# Patient Record
Sex: Male | Born: 1985
Health system: Southern US, Community
[De-identification: ages and names within clinical notes are randomized; demographics above are authoritative.]

## PROBLEM LIST (undated history)

## (undated) DIAGNOSIS — Z9109 Other allergy status, other than to drugs and biological substances: Secondary | ICD-10-CM

## (undated) HISTORY — DX: Other allergy status, other than to drugs and biological substances: Z91.09

## (undated) HISTORY — PX: VASECTOMY: SHX75

## (undated) HISTORY — PX: HERNIA REPAIR: SHX51

---

## 2004-08-20 ENCOUNTER — Ambulatory Visit: Payer: Self-pay | Admitting: Family Medicine

## 2005-05-24 ENCOUNTER — Ambulatory Visit: Payer: Self-pay | Admitting: Family Medicine

## 2005-11-09 ENCOUNTER — Ambulatory Visit: Payer: Self-pay | Admitting: Family Medicine

## 2006-08-02 ENCOUNTER — Encounter: Payer: Self-pay | Admitting: Family Medicine

## 2006-08-02 ENCOUNTER — Ambulatory Visit: Payer: Self-pay | Admitting: Family Medicine

## 2006-08-02 DIAGNOSIS — J309 Allergic rhinitis, unspecified: Secondary | ICD-10-CM | POA: Insufficient documentation

## 2007-02-09 ENCOUNTER — Ambulatory Visit: Payer: Self-pay | Admitting: Family Medicine

## 2007-02-09 DIAGNOSIS — L600 Ingrowing nail: Secondary | ICD-10-CM | POA: Insufficient documentation

## 2007-10-03 ENCOUNTER — Ambulatory Visit: Payer: Self-pay | Admitting: Family Medicine

## 2007-10-03 DIAGNOSIS — L740 Miliaria rubra: Secondary | ICD-10-CM

## 2007-10-04 ENCOUNTER — Telehealth: Payer: Self-pay | Admitting: Gastroenterology

## 2007-10-04 ENCOUNTER — Telehealth (INDEPENDENT_AMBULATORY_CARE_PROVIDER_SITE_OTHER): Payer: Self-pay | Admitting: Internal Medicine

## 2007-10-05 ENCOUNTER — Encounter (INDEPENDENT_AMBULATORY_CARE_PROVIDER_SITE_OTHER): Payer: Self-pay | Admitting: Internal Medicine

## 2007-10-05 ENCOUNTER — Ambulatory Visit: Payer: Self-pay | Admitting: Gastroenterology

## 2007-10-05 DIAGNOSIS — K625 Hemorrhage of anus and rectum: Secondary | ICD-10-CM

## 2007-10-05 LAB — CONVERTED CEMR LAB
AST: 17 units/L (ref 0–37)
Alkaline Phosphatase: 73 units/L (ref 39–117)
Basophils Absolute: 0.1 10*3/uL (ref 0.0–0.1)
Chloride: 103 meq/L (ref 96–112)
Eosinophils Absolute: 0.7 10*3/uL (ref 0.0–0.7)
Eosinophils Relative: 10.5 % — ABNORMAL HIGH (ref 0.0–5.0)
Ferritin: 22.1 ng/mL (ref 22.0–322.0)
GFR calc Af Amer: 120 mL/min
GFR calc non Af Amer: 99 mL/min
MCHC: 34.3 g/dL (ref 30.0–36.0)
MCV: 89 fL (ref 78.0–100.0)
Neutrophils Relative %: 50.8 % (ref 43.0–77.0)
Platelets: 325 10*3/uL (ref 150–400)
Potassium: 4.8 meq/L (ref 3.5–5.1)
RDW: 12.6 % (ref 11.5–14.6)
Rhuematoid fact SerPl-aCnc: <20 intl units/mL — ABNORMAL LOW (ref 0.0–20.0)
Saturation Ratios: 11.9 % — ABNORMAL LOW (ref 20.0–50.0)
Sodium: 139 meq/L (ref 135–145)
TSH: 1.08 microintl units/mL (ref 0.35–5.50)
Total Bilirubin: 2.3 mg/dL — ABNORMAL HIGH (ref 0.3–1.2)
Vitamin B-12: 1005 pg/mL — ABNORMAL HIGH (ref 211–911)
WBC: 6.8 10*3/uL (ref 4.5–10.5)

## 2007-10-08 ENCOUNTER — Encounter: Payer: Self-pay | Admitting: Gastroenterology

## 2007-10-08 ENCOUNTER — Ambulatory Visit: Payer: Self-pay | Admitting: Gastroenterology

## 2007-10-09 ENCOUNTER — Telehealth: Payer: Self-pay | Admitting: Gastroenterology

## 2007-10-10 ENCOUNTER — Telehealth: Payer: Self-pay | Admitting: Gastroenterology

## 2007-10-11 ENCOUNTER — Ambulatory Visit: Payer: Self-pay | Admitting: Gastroenterology

## 2007-10-11 ENCOUNTER — Encounter (INDEPENDENT_AMBULATORY_CARE_PROVIDER_SITE_OTHER): Payer: Self-pay | Admitting: *Deleted

## 2007-10-11 DIAGNOSIS — K5150 Left sided colitis without complications: Secondary | ICD-10-CM

## 2007-10-15 ENCOUNTER — Encounter: Payer: Self-pay | Admitting: Gastroenterology

## 2007-10-24 ENCOUNTER — Telehealth: Payer: Self-pay | Admitting: Gastroenterology

## 2007-10-25 ENCOUNTER — Ambulatory Visit: Payer: Self-pay | Admitting: Gastroenterology

## 2007-10-25 ENCOUNTER — Emergency Department (HOSPITAL_COMMUNITY): Admission: EM | Admit: 2007-10-25 | Discharge: 2007-10-26 | Payer: Self-pay | Admitting: Emergency Medicine

## 2007-10-27 ENCOUNTER — Emergency Department (HOSPITAL_COMMUNITY): Admission: EM | Admit: 2007-10-27 | Discharge: 2007-10-27 | Payer: Self-pay | Admitting: Emergency Medicine

## 2007-10-29 ENCOUNTER — Inpatient Hospital Stay (HOSPITAL_COMMUNITY): Admission: AD | Admit: 2007-10-29 | Discharge: 2007-11-03 | Payer: Self-pay | Admitting: Gastroenterology

## 2007-10-29 ENCOUNTER — Telehealth: Payer: Self-pay | Admitting: Gastroenterology

## 2007-10-29 ENCOUNTER — Telehealth: Payer: Self-pay | Admitting: Family Medicine

## 2007-10-30 LAB — CONVERTED CEMR LAB
Albumin: 3.4 g/dL — ABNORMAL LOW (ref 3.5–5.2)
Basophils Absolute: 0 10*3/uL (ref 0.0–0.1)
Basophils Relative: 0.1 % (ref 0.0–3.0)
Bilirubin Urine: NEGATIVE
Crystals: NEGATIVE
Eosinophils Absolute: 0.8 10*3/uL — ABNORMAL HIGH (ref 0.0–0.7)
Hemoglobin: 13.9 g/dL (ref 13.0–17.0)
Lymphocytes Relative: 9.4 % — ABNORMAL LOW (ref 12.0–46.0)
MCHC: 35 g/dL (ref 30.0–36.0)
MCV: 88.2 fL (ref 78.0–100.0)
Neutro Abs: 9.7 10*3/uL — ABNORMAL HIGH (ref 1.4–7.7)
RBC: 4.49 M/uL (ref 4.22–5.81)
RDW: 12.2 % (ref 11.5–14.6)
Squamous Epithelial / LPF: NEGATIVE /lpf
Total Bilirubin: 1.3 mg/dL — ABNORMAL HIGH (ref 0.3–1.2)
Urine Glucose: NEGATIVE mg/dL
Urobilinogen, UA: 0.2 (ref 0.0–1.0)
pH: 6 (ref 5.0–8.0)

## 2007-11-02 ENCOUNTER — Telehealth: Payer: Self-pay | Admitting: Gastroenterology

## 2007-11-05 ENCOUNTER — Ambulatory Visit: Payer: Self-pay | Admitting: Gastroenterology

## 2007-11-06 ENCOUNTER — Ambulatory Visit: Payer: Self-pay | Admitting: Gastroenterology

## 2007-11-28 ENCOUNTER — Telehealth: Payer: Self-pay | Admitting: Gastroenterology

## 2007-11-29 ENCOUNTER — Ambulatory Visit: Payer: Self-pay | Admitting: Family Medicine

## 2007-11-29 ENCOUNTER — Telehealth: Payer: Self-pay | Admitting: Gastroenterology

## 2007-12-04 LAB — CONVERTED CEMR LAB
AST: 15 units/L (ref 0–37)
Albumin: 3.9 g/dL (ref 3.5–5.2)
BUN: 14 mg/dL (ref 6–23)
Calcium: 9.5 mg/dL (ref 8.4–10.5)
Chloride: 107 meq/L (ref 96–112)
Cholesterol: 227 mg/dL (ref 0–200)
Creatinine, Ser: 0.8 mg/dL (ref 0.4–1.5)
GFR calc non Af Amer: 128 mL/min
HCT: 38.9 % — ABNORMAL LOW (ref 39.0–52.0)
HDL: 113.7 mg/dL (ref >39.0)
Hemoglobin: 13.3 g/dL (ref 13.0–17.0)
TSH: 0.51 microintl units/mL (ref 0.35–5.50)
Total Bilirubin: 1.4 mg/dL — ABNORMAL HIGH (ref 0.3–1.2)
Total CHOL/HDL Ratio: 2
Triglycerides: 46 mg/dL (ref 0–149)
VLDL: 9 mg/dL (ref 0–40)

## 2007-12-06 ENCOUNTER — Ambulatory Visit: Payer: Self-pay | Admitting: Gastroenterology

## 2007-12-11 ENCOUNTER — Encounter: Payer: Self-pay | Admitting: Gastroenterology

## 2007-12-20 ENCOUNTER — Telehealth: Payer: Self-pay | Admitting: Gastroenterology

## 2007-12-25 ENCOUNTER — Telehealth: Payer: Self-pay | Admitting: Gastroenterology

## 2007-12-31 ENCOUNTER — Telehealth: Payer: Self-pay | Admitting: Gastroenterology

## 2008-01-02 ENCOUNTER — Ambulatory Visit: Payer: Self-pay | Admitting: Gastroenterology

## 2008-01-02 LAB — CONVERTED CEMR LAB
Alkaline Phosphatase: 49 units/L (ref 39–117)
Bilirubin, Direct: 0.2 mg/dL (ref 0.0–0.3)
Eosinophils Absolute: 0.1 10*3/uL (ref 0.0–0.7)
HCT: 38.5 % — ABNORMAL LOW (ref 39.0–52.0)
MCV: 86.5 fL (ref 78.0–100.0)
Monocytes Absolute: 0.6 10*3/uL (ref 0.1–1.0)
Monocytes Relative: 6.9 % (ref 3.0–12.0)
Neutrophils Relative %: 76.5 % (ref 43.0–77.0)
Platelets: 267 10*3/uL (ref 150–400)
RDW: 13.6 % (ref 11.5–14.6)
Total Bilirubin: 1.5 mg/dL — ABNORMAL HIGH (ref 0.3–1.2)
WBC: 9 10*3/uL (ref 4.5–10.5)

## 2008-01-15 ENCOUNTER — Telehealth: Payer: Self-pay | Admitting: Gastroenterology

## 2008-01-25 ENCOUNTER — Ambulatory Visit: Payer: Self-pay | Admitting: Family Medicine

## 2008-02-13 ENCOUNTER — Telehealth: Payer: Self-pay | Admitting: Gastroenterology

## 2008-02-15 ENCOUNTER — Ambulatory Visit: Payer: Self-pay | Admitting: Gastroenterology

## 2008-02-15 LAB — CONVERTED CEMR LAB
Albumin: 4 g/dL (ref 3.5–5.2)
Basophils Absolute: 0 10*3/uL (ref 0.0–0.1)
Basophils Relative: 0.4 % (ref 0.0–3.0)
Eosinophils Absolute: 0.2 10*3/uL (ref 0.0–0.7)
Hemoglobin: 13.5 g/dL (ref 13.0–17.0)
MCHC: 34.4 g/dL (ref 30.0–36.0)
MCV: 87.2 fL (ref 78.0–100.0)
Monocytes Absolute: 0.4 10*3/uL (ref 0.1–1.0)
Neutro Abs: 3.2 10*3/uL (ref 1.4–7.7)
RBC: 4.51 M/uL (ref 4.22–5.81)
RDW: 14.4 % (ref 11.5–14.6)

## 2008-02-18 ENCOUNTER — Telehealth: Payer: Self-pay | Admitting: Gastroenterology

## 2008-02-19 ENCOUNTER — Telehealth: Payer: Self-pay | Admitting: Gastroenterology

## 2008-03-07 ENCOUNTER — Telehealth: Payer: Self-pay | Admitting: Internal Medicine

## 2008-05-12 ENCOUNTER — Telehealth: Payer: Self-pay | Admitting: Gastroenterology

## 2008-05-19 ENCOUNTER — Ambulatory Visit: Payer: Self-pay | Admitting: Gastroenterology

## 2008-05-19 LAB — CONVERTED CEMR LAB
ALT: 12 units/L (ref 0–53)
Basophils Relative: 0.4 % (ref 0.0–3.0)
Bilirubin, Direct: 0.2 mg/dL (ref 0.0–0.3)
Eosinophils Relative: 3.9 % (ref 0.0–5.0)
HCT: 38.4 % — ABNORMAL LOW (ref 39.0–52.0)
Hemoglobin: 13.6 g/dL (ref 13.0–17.0)
Monocytes Absolute: 0.3 10*3/uL (ref 0.1–1.0)
Monocytes Relative: 6.7 % (ref 3.0–12.0)
Neutro Abs: 2.9 10*3/uL (ref 1.4–7.7)
Platelets: 269 10*3/uL (ref 150–400)
RBC: 4.14 M/uL — ABNORMAL LOW (ref 4.22–5.81)
Total Protein: 7 g/dL (ref 6.0–8.3)
WBC: 5.2 10*3/uL (ref 4.5–10.5)

## 2008-11-11 ENCOUNTER — Telehealth: Payer: Self-pay | Admitting: Gastroenterology

## 2008-12-12 ENCOUNTER — Ambulatory Visit: Payer: Self-pay | Admitting: Gastroenterology

## 2008-12-12 LAB — CONVERTED CEMR LAB
Alkaline Phosphatase: 69 units/L (ref 39–117)
Basophils Absolute: 0.1 10*3/uL (ref 0.0–0.1)
Basophils Relative: 0.9 % (ref 0.0–3.0)
Bilirubin, Direct: 0.2 mg/dL (ref 0.0–0.3)
Eosinophils Absolute: 0.2 10*3/uL (ref 0.0–0.7)
Lymphocytes Relative: 32.1 % (ref 12.0–46.0)
MCHC: 33.9 g/dL (ref 30.0–36.0)
Neutrophils Relative %: 58.6 % (ref 43.0–77.0)
RBC: 4.25 M/uL (ref 4.22–5.81)
RDW: 13.6 % (ref 11.5–14.6)

## 2008-12-15 ENCOUNTER — Ambulatory Visit: Payer: Self-pay | Admitting: Gastroenterology

## 2008-12-15 DIAGNOSIS — R945 Abnormal results of liver function studies: Secondary | ICD-10-CM | POA: Insufficient documentation

## 2008-12-17 ENCOUNTER — Ambulatory Visit (HOSPITAL_COMMUNITY): Admission: RE | Admit: 2008-12-17 | Discharge: 2008-12-17 | Payer: Self-pay | Admitting: Gastroenterology

## 2008-12-18 ENCOUNTER — Encounter: Payer: Self-pay | Admitting: Gastroenterology

## 2009-01-22 IMAGING — CT CT ABDOMEN W/ CM
1 of 3 series · 14 of 32 positions shown, 19 images · IV contrast (APPLIED)
Comparison: None.

CT ABDOMEN

CLINICAL DATA: 21-year-old male abdominal pain nausea, diarrhea

CT ABDOMEN AND PELVIS WITH CONTRAST
TECHNIQUE: Multidetector CT imaging of the abdomen and pelvis was
performed using the standard protocol following bolus
administration of intravenous contrast.
Contrast: 100 ml 6mnipaque-2EE

[Series 2: abd/pelv with 5.0 b31f st · axial · 0.63mm/px · z∈[-822,-372]mm · 14 of 100 slices shown, 19 images]
[im 5/100  soft-tissue]
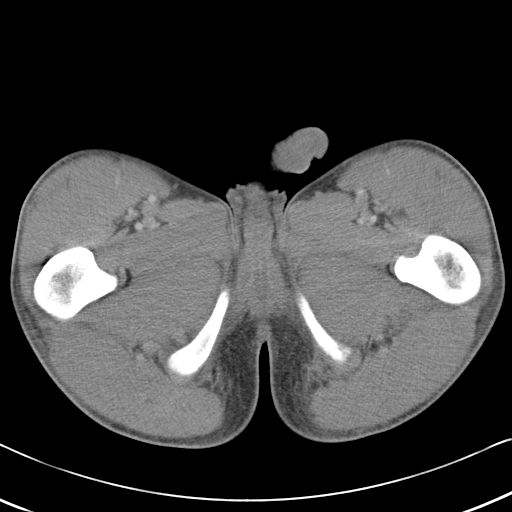
[im 5/100  bone]
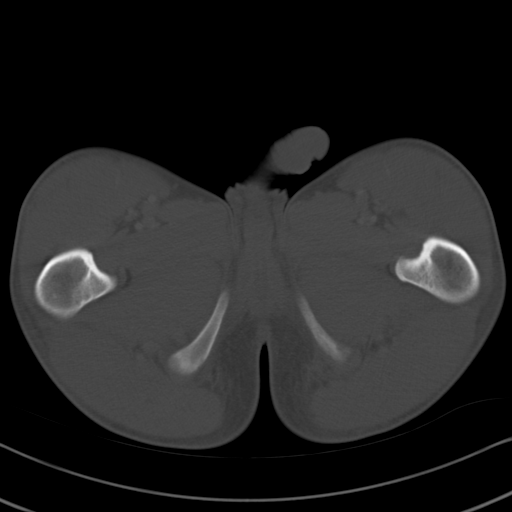
[im 15/100  soft-tissue]
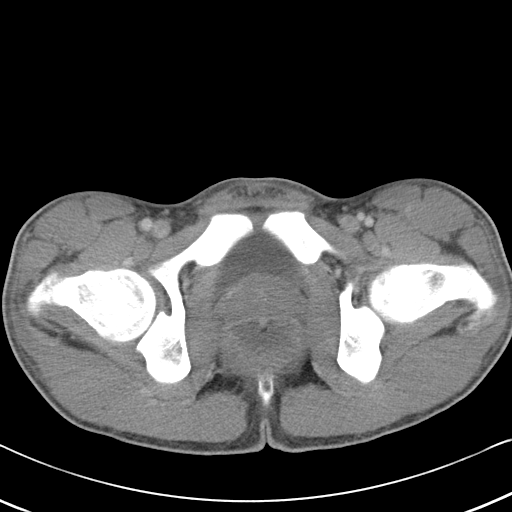
[im 19/100  soft-tissue]
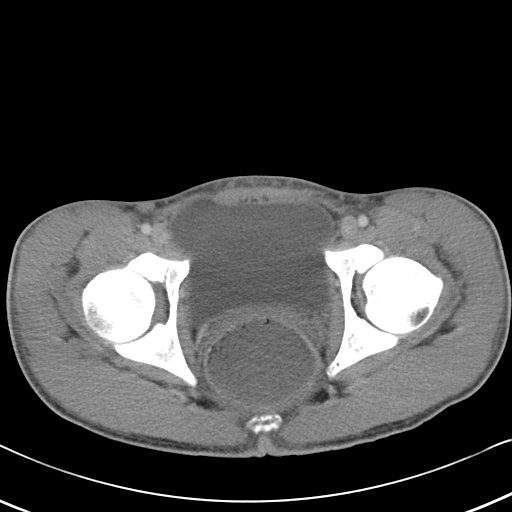
[im 29/100  soft-tissue]
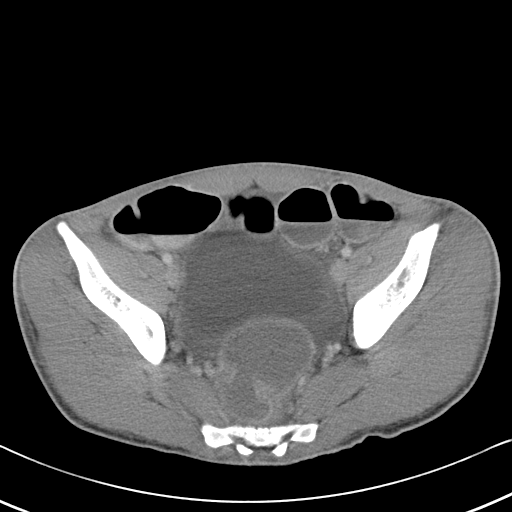
[im 34/100  soft-tissue]
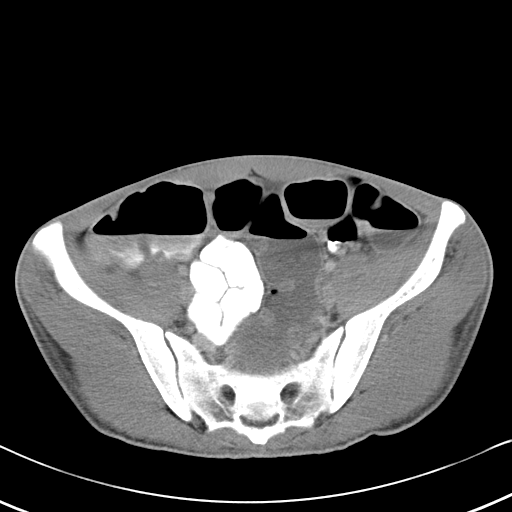
[im 43/100  soft-tissue]
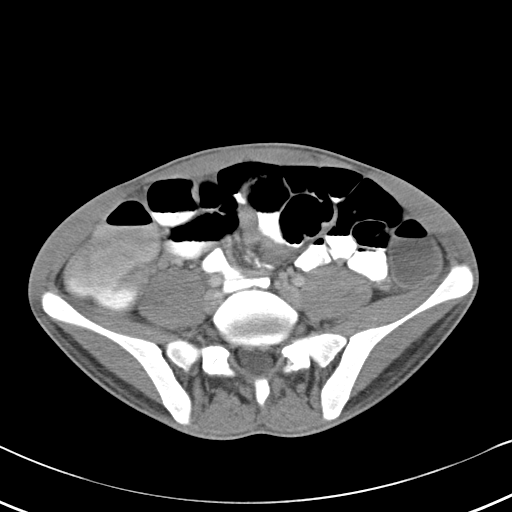
[im 52/100  soft-tissue]
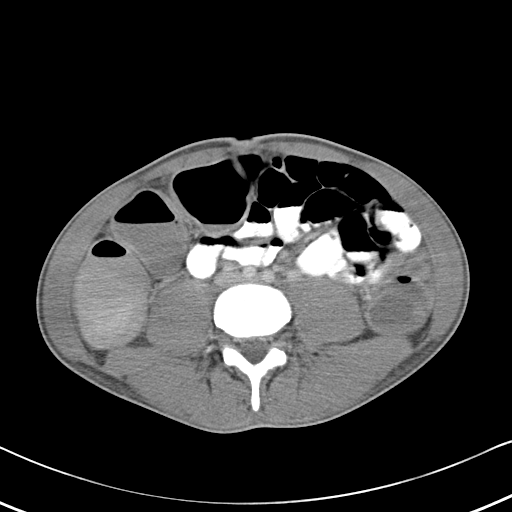
[im 57/100  soft-tissue]
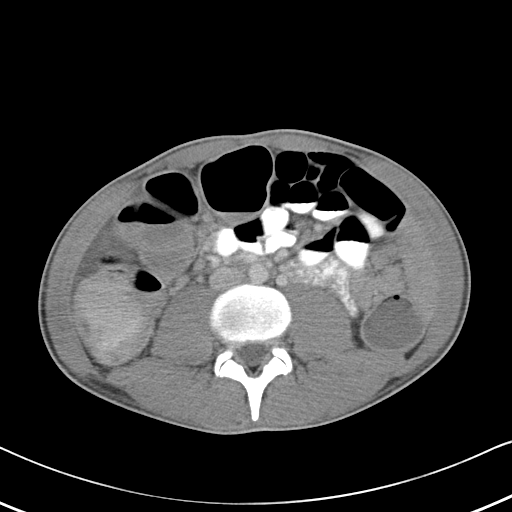
[im 67/100  soft-tissue]
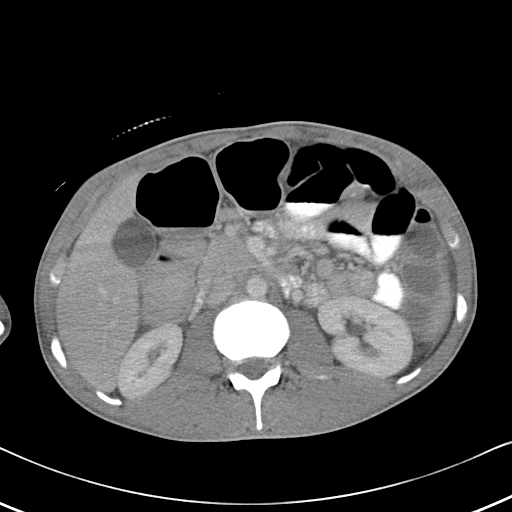
[im 67/100  bone]
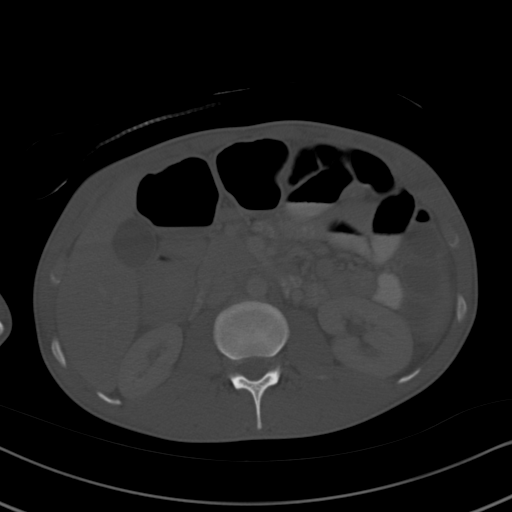
[im 71/100  soft-tissue]
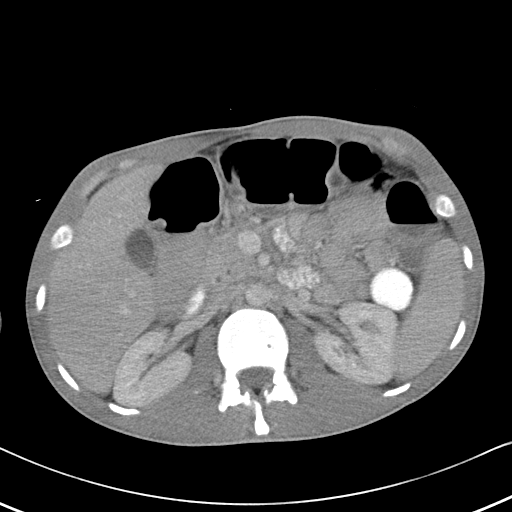
[im 81/100  soft-tissue]
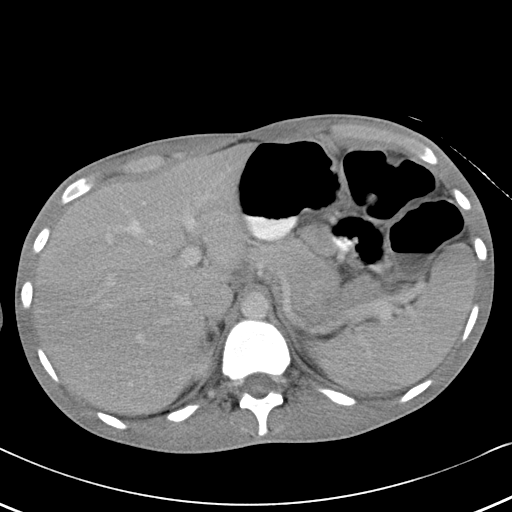
[im 81/100  lung]
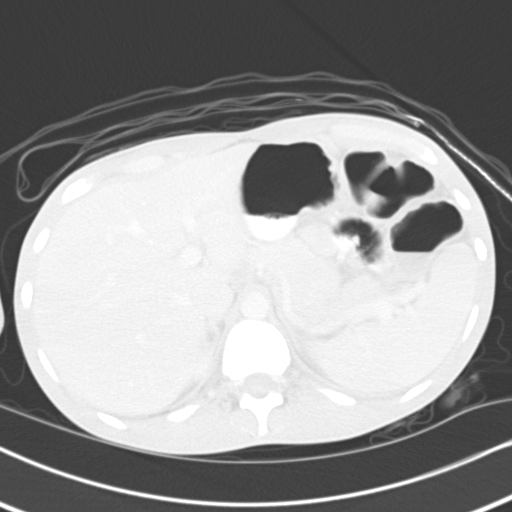
[im 85/100  soft-tissue]
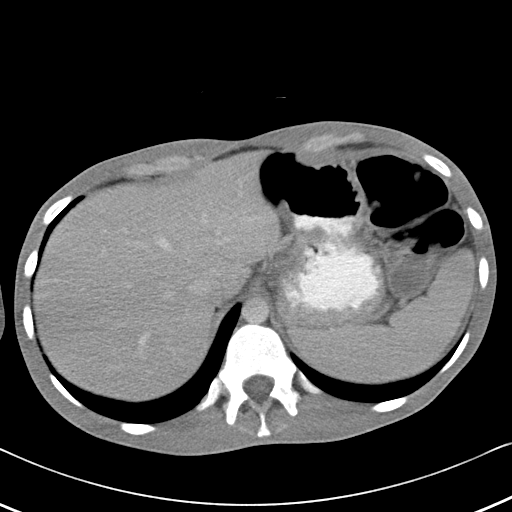
[im 85/100  lung]
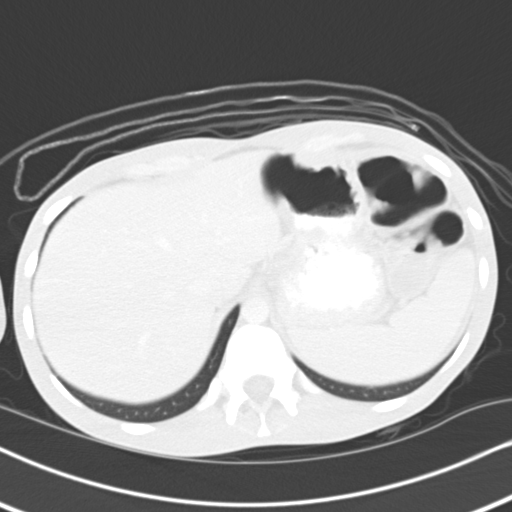
[im 90/100  lung]
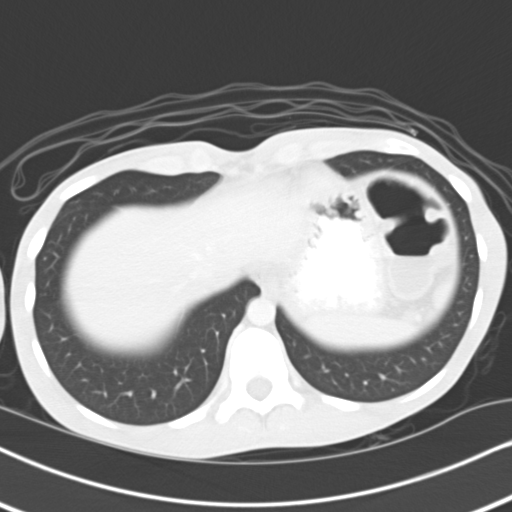
[im 95/100  soft-tissue]
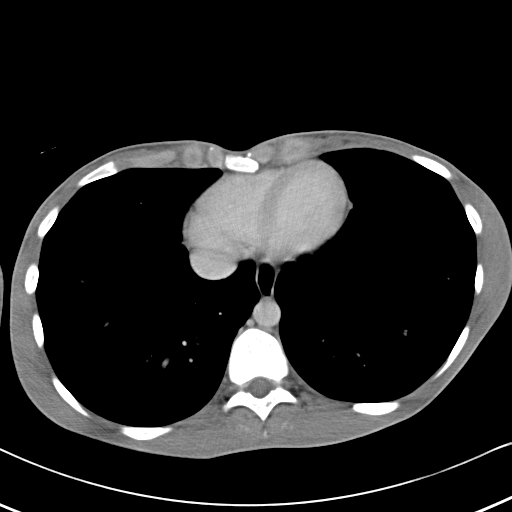
[im 95/100  lung]
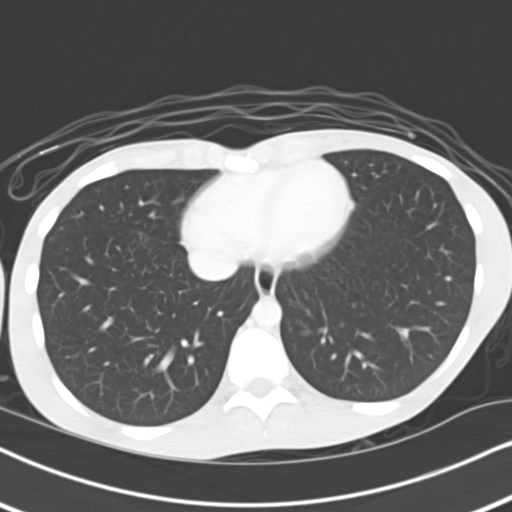

[14 of 32 positions shown; findings below may reference images not displayed]

FINDINGS: Lung bases are clear.  Normal heart size.  No
pericardial or pleural effusion.  No hiatal hernia.

In the abdomen, the liver, gallbladder, biliary system, adrenal
glands, kidneys, spleen, and pancreas are within normal limits.
The small and large bowel demonstrate mild distension with diffuse
multiple air fluid levels.  No definite wall thickening, mesenteric
inflammation, fluid collection, or free air.  Contrast is seen into
the right colon.  Therefore, there is no small bowel obstruction.
The bowel findings are nonspecific but can be seen with a diarrheal
state.
IMPRESSION: Mild distension of the small and large bowel diffusely with air-
fluid levels consistent with a diarrheal state.  Findings are
nonspecific.  No definite bowel wall thickening, obstruction or
free air.

CT PELVIS
FINDINGS: Mild distension of the distal small bowel and colon.
Air fluid levels are also noted in the rectosigmoid colon.  Small
amount of free fluid in the pelvis.  Bladder is moderately
distended.  No adenopathy, fluid collection, or abscess.
IMPRESSION: Fluid-filled bowel with air-fluid levels as described.

Small amount of free pelvic fluid.

## 2009-06-15 ENCOUNTER — Telehealth: Payer: Self-pay | Admitting: Gastroenterology

## 2009-06-16 ENCOUNTER — Ambulatory Visit: Payer: Self-pay | Admitting: Gastroenterology

## 2009-06-16 LAB — CONVERTED CEMR LAB
Albumin: 4.3 g/dL (ref 3.5–5.2)
Basophils Absolute: 0 10*3/uL (ref 0.0–0.1)
Eosinophils Absolute: 0.1 10*3/uL (ref 0.0–0.7)
HCT: 40.8 % (ref 39.0–52.0)
Hemoglobin: 13.8 g/dL (ref 13.0–17.0)
Lymphs Abs: 1.3 10*3/uL (ref 0.7–4.0)
MCHC: 33.8 g/dL (ref 30.0–36.0)
MCV: 98.1 fL (ref 78.0–100.0)
Neutro Abs: 4.6 10*3/uL (ref 1.4–7.7)
RDW: 13.9 % (ref 11.5–14.6)

## 2009-06-23 ENCOUNTER — Telehealth: Payer: Self-pay | Admitting: Gastroenterology

## 2010-02-15 ENCOUNTER — Telehealth: Payer: Self-pay | Admitting: Gastroenterology

## 2010-02-26 ENCOUNTER — Ambulatory Visit: Payer: Self-pay | Admitting: Gastroenterology

## 2010-02-26 LAB — CONVERTED CEMR LAB
ALT: 17 units/L (ref 0–53)
AST: 20 units/L (ref 0–37)
Alkaline Phosphatase: 56 units/L (ref 39–117)
Eosinophils Relative: 2.2 % (ref 0.0–5.0)
HCT: 40.9 % (ref 39.0–52.0)
Hemoglobin: 14.1 g/dL (ref 13.0–17.0)
Lymphocytes Relative: 18.2 % (ref 12.0–46.0)
Lymphs Abs: 1.1 10*3/uL (ref 0.7–4.0)
Monocytes Relative: 7.3 % (ref 3.0–12.0)
Neutro Abs: 4.4 10*3/uL (ref 1.4–7.7)
Platelets: 216 10*3/uL (ref 150.0–400.0)
Total Bilirubin: 2 mg/dL — ABNORMAL HIGH (ref 0.3–1.2)
WBC: 6.1 10*3/uL (ref 4.5–10.5)

## 2010-03-15 IMAGING — US US ABDOMEN COMPLETE
1 series · 14 of 25 positions shown · non-contrast
Comparison: None.

CLINICAL DATA: Abdominal pain

COMPLETE ABDOMINAL ULTRASOUND

[Series 1: us abdomen complete · 0.23mm/px · 14 of 75 slices shown]
[im 1/75]
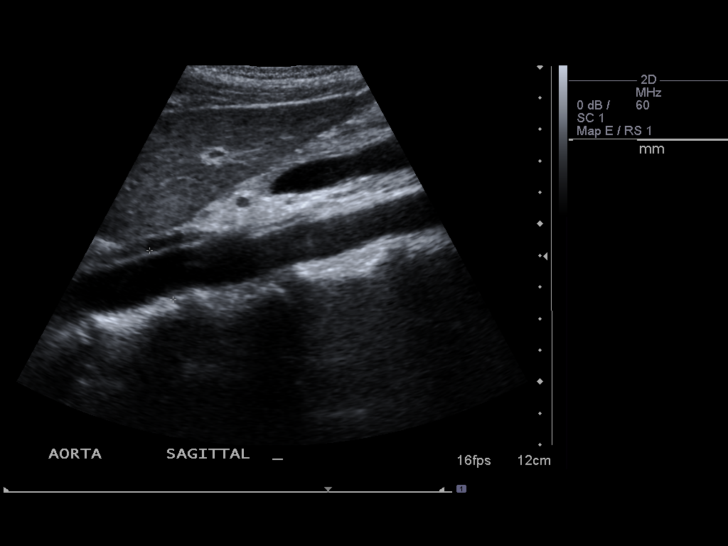
[im 7/75]
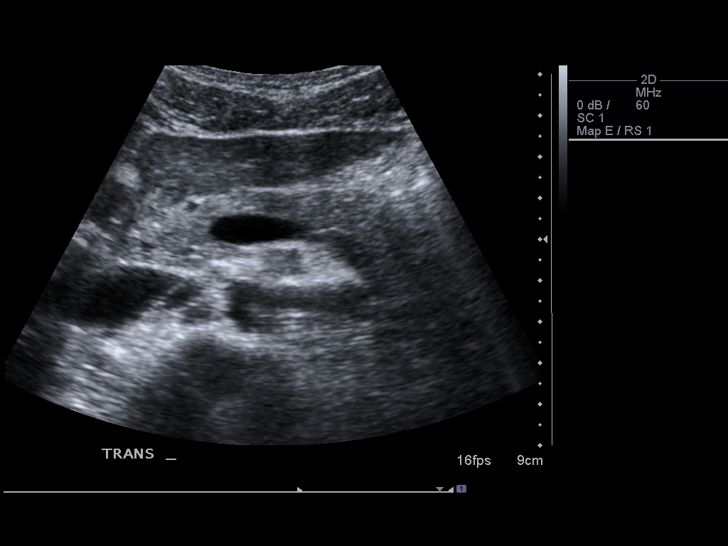
[im 13/75]
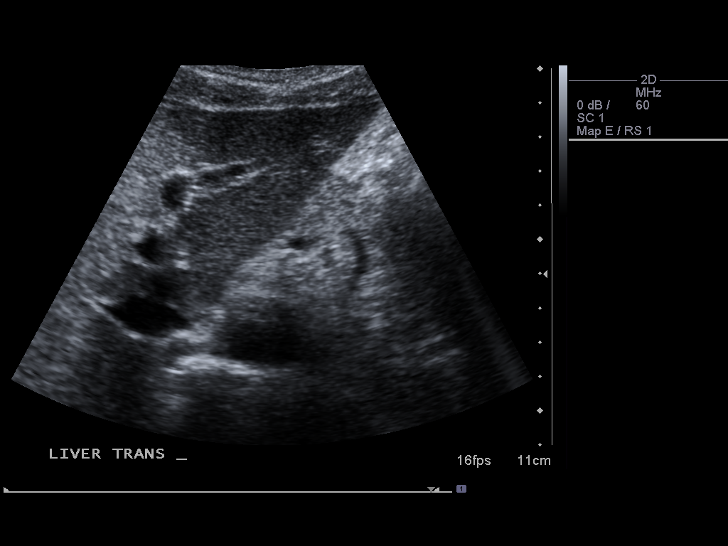
[im 19/75]
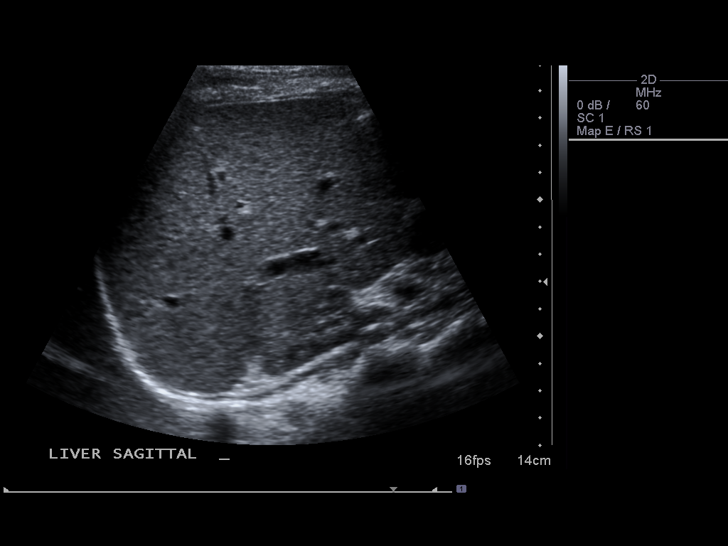
[im 25/75]
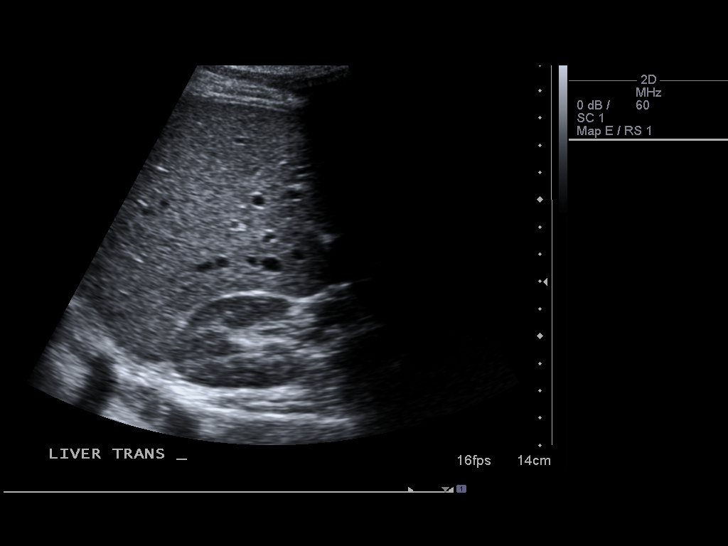
[im 28/75]
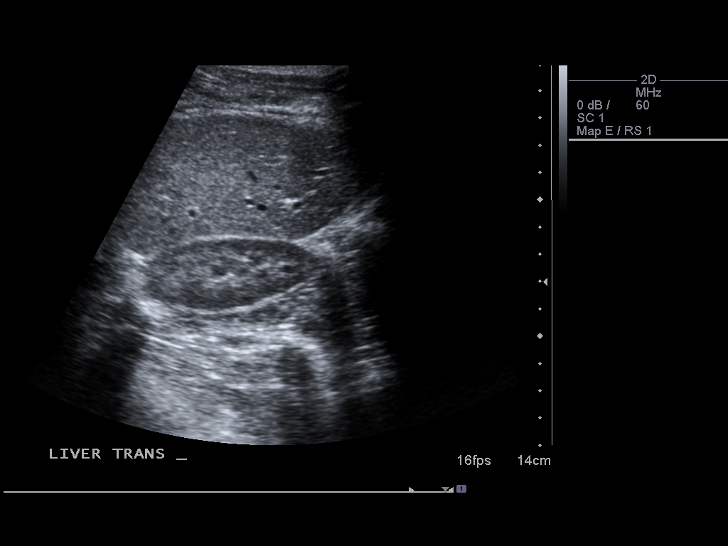
[im 34/75]
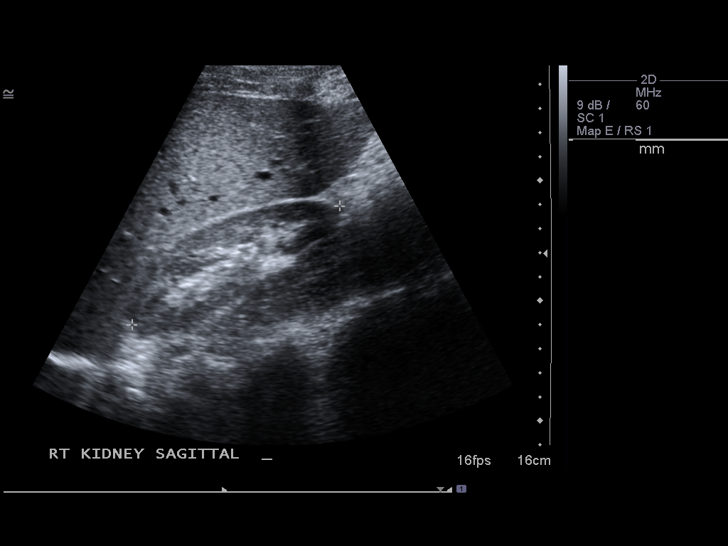
[im 41/75]
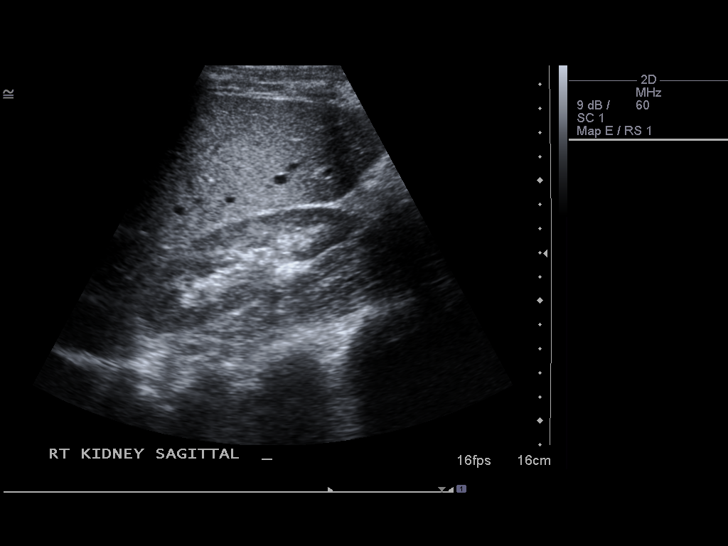
[im 47/75]
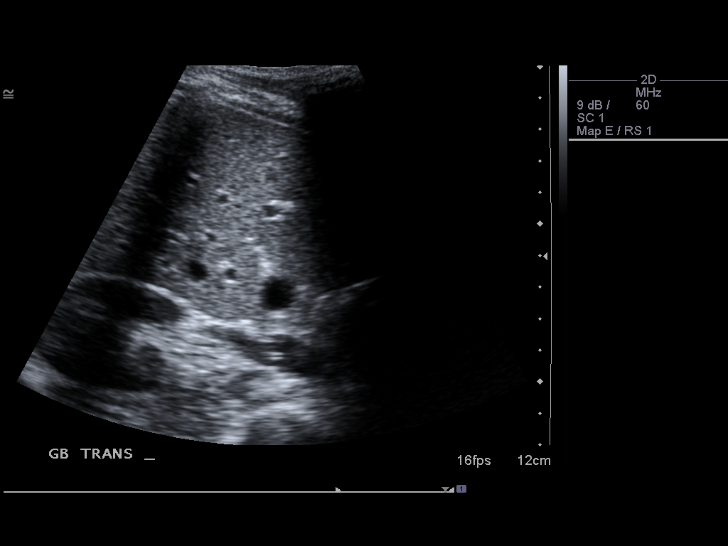
[im 50/75]
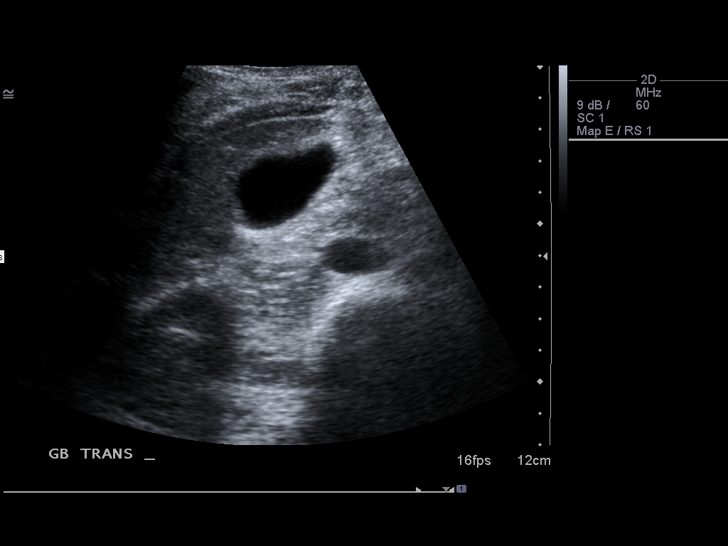
[im 56/75]
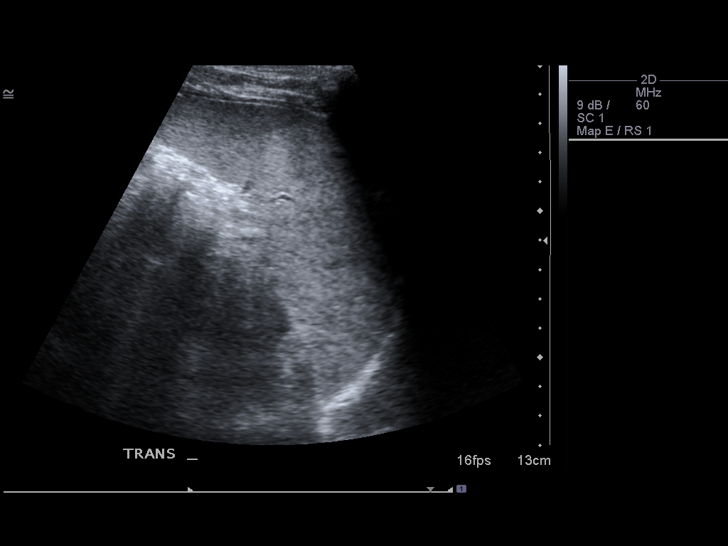
[im 62/75]
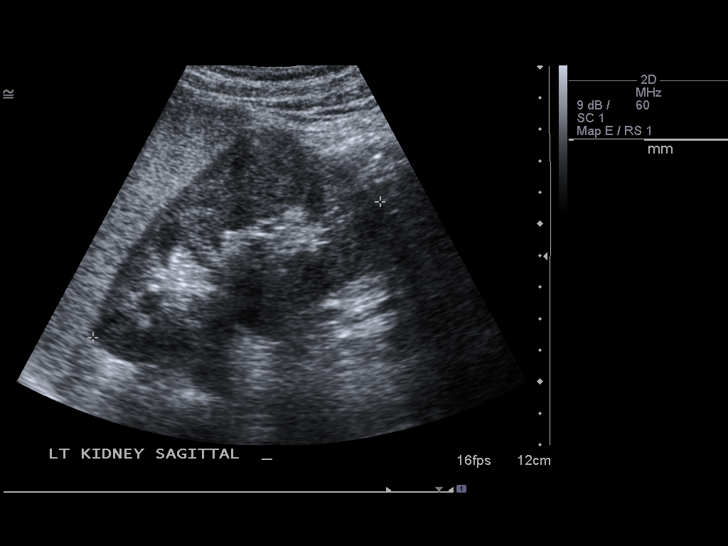
[im 68/75]
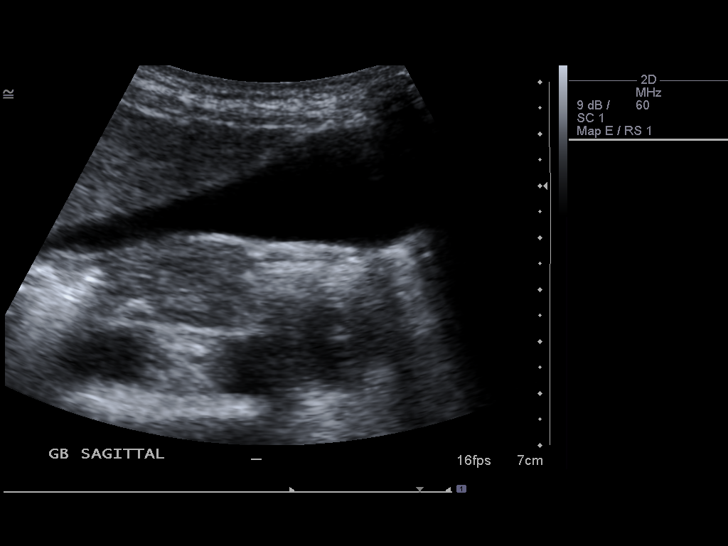
[im 75/75]
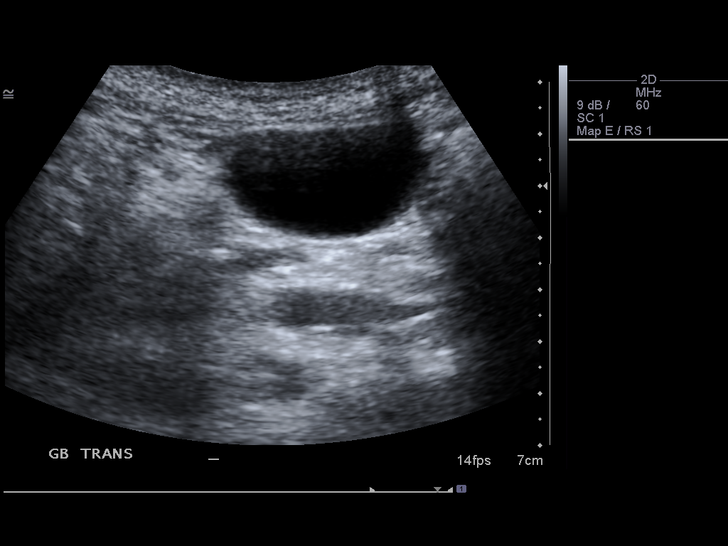

[14 of 25 positions shown; findings below may reference images not displayed]

FINDINGS: Gallbladder:  No gallstones, gallbladder wall thickening, or
pericholecystic fluid.

Common bile duct:  1.4 mm

Liver:  No focal lesion identified.  Within normal limits in
parenchymal echogenicity.

IVC:  Appears normal.

Pancreas:  No focal abnormality seen.

Spleen:  Within normal limits in size and echotexture.

Right Kidney:  Normal in size and parenchymal echogenicity.  No
mass or hydronephrosis.

Left Kidney:  Normal in size and parenchymal echogenicity.  No mass
or hydronephrosis.

Abdominal aorta:  No aneurysm identified.
IMPRESSION: No acute or significant findings.

## 2010-04-12 ENCOUNTER — Ambulatory Visit
Admission: RE | Admit: 2010-04-12 | Discharge: 2010-04-12 | Payer: Self-pay | Source: Home / Self Care | Attending: Gastroenterology | Admitting: Gastroenterology

## 2010-04-27 NOTE — Progress Notes (Signed)
Summary: Reminder call for 6 m F/U labs  Phone Note Outgoing Call Call back at [facility] Phone [phone]   Call placed by: Genella Mech CMA Deborra Medina),  June 15, 2009 11:14 AM Summary of Call: Called pt to inform labs are dur. CBC,LFT's orders in IDX  l/m Initial call taken by: Genella Mech CMA Deborra Medina),  June 15, 2009 11:15 AM

## 2010-04-27 NOTE — Progress Notes (Signed)
Summary: Refill request  Phone Note Refill Request Message from:  Fax from Pharmacy on June 23, 2009 8:19 AM  Refills Requested: Medication #1:  AZATHIOPRINE 50 MG TABS Take 2 1/2  tablets daily (173m).   Dosage confirmed as above?Dosage Confirmed   Brand Name Necessary? No   Supply Requested: 6 months Initial call taken by: RGenella MechCMA (Deborra Medina,  June 23, 2009 8:19 AM    New/Updated Medications: AZATHIOPRINE 50 MG TABS (AZATHIOPRINE) Take 2 1/2  tablets daily (1233m Prescriptions: AZATHIOPRINE 50 MG TABS (AZATHIOPRINE) Take 2 1/2  tablets daily (12565m #90 x 4   Entered by:   RobGenella MechA (AAM[place] Authorized by:   RobInda Castle   Signed by:   RobGenella MechA (AAMBentonn 06/23/2009   Method used:   [facility]        [facility] [address]       [address]    [place]  [id number]    Ph: [phone]    Fax: [phone]:   [id number]

## 2010-04-27 NOTE — Progress Notes (Signed)
Summary: Labs Are Due  Phone Note Outgoing Call Call back at [facility] Phone [phone]   Call placed by: Genella Mech CMA Deborra Medina),  February 15, 2010 2:35 PM Summary of Call: Called pt to inform labs are past due, L/M for pt to come to our basement as well as make a yearly follow up appinment with Dr Deatra Ina. Pt also wants refill on Azathioprine Initial call taken by: Genella Mech CMA Deborra Medina),  February 15, 2010 2:37 PM    Prescriptions: AZATHIOPRINE 50 MG TABS (AZATHIOPRINE) Take 2 1/2  tablets daily (149m)  #90 x 2   Entered by:   RGenella MechCMA (ASeg[place]   Authorized by:   RInda CastleMD   Signed by:   RGenella MechCMA (AMechanics[place] on 02/15/2010   Method used:   Electronically to        [facility]       [address]      [place]  [address]      Ph: [phone]      Fax: [phone]  RxID::   [id number]

## 2010-04-29 NOTE — Assessment & Plan Note (Signed)
Summary: ANNUAL...AS.   History of Present Illness Visit Type: Follow-up Visit Primary GI MD: Jeffrey Emery MD [facility] Primary Provider: Loura Jenkins, M.D. Requesting Provider: NA Chief Complaint: Yearly f/u for ulcerative colitis. Pt denies any GI complaints  History of Present Illness:   Jeffrey Jenkins has returned for his annual visit for his left-sided ulcerative colitis.  On imuran 125 mg daily he has remained in clinical remission.  He has no GI complaints.  Lab work has remained normal.   GI Review of Systems      Denies abdominal pain, acid reflux, belching, bloating, chest pain, dysphagia with liquids, dysphagia with solids, heartburn, loss of appetite, nausea, vomiting, vomiting blood, weight loss, and  weight gain.        Denies anal fissure, black tarry stools, change in bowel habit, constipation, diarrhea, diverticulosis, fecal incontinence, heme positive stool, hemorrhoids, irritable bowel syndrome, jaundice, light color stool, liver problems, rectal bleeding, and  rectal pain.    Current Medications (verified): 1)  Zyrtec-D Allergy & Congestion 5-120 Mg Xr12h-Tab (Cetirizine-Pseudoephedrine) .... As Needed 2)  Multivitamins   Tabs (Multiple Vitamin) .... Take 1 Tablet By Mouth Once A Day 3)  Azathioprine 50 Mg Tabs (Azathioprine) .... Take 2 1/2  Tablets Daily (13m) 4)  Clarinex-D 24 Hour 5-240 Mg Xr24h-Tab (Desloratadine-Pseudoephedrine) .... As Needed  Allergies (verified): 1)  ! * Lialda  Past History:  Past Medical History: ULCERATIVE COLITIS (ICD-556.9) NONSPECIFIC ABNORMAL RESULTS LIVR FUNCTION STUDY (ICD-794.8) ACUTE PANCREATITIS (ICD-577.0) HEALTH MAINTENANCE EXAM (ICD-V70.0) ULCERATIVE COLITIS-LEFT SIDE (ICD-556.5) FECAL INCONTINENCE (ICD-787.6) RECTAL BLEEDING (ICD-569.3) HEAT RASH (ICD-705.1) INGROWN TOENAIL, INFECTED (ICD-703.0) ALLERGIC RHINITIS (ICD-477.9)  GI-- KDeatra Jenkins Past Surgical History: Reviewed history from 11/29/2007 and no changes  required. Inguinal hernia repair (10/2005) colonosc- [facility] 8[facility]- [facility]  Family History: Reviewed history from 11/29/2007 and no changes required. Father:  Mother: IBS  Siblings: 1 sister Paternal GM with pancreatic ca Family History of Heart Disease: Maternal grandfather  Family History of Diabetes: Maternal grandfather No FH of Colon Cancer  Social History: Reviewed history from 12/15/2008 and no changes required. 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  Alcohol Use - no exercise- bikes to work  Daily Caffeine Use  Review of Systems       The patient complains of allergy/sinus.  The patient denies anemia, anxiety-new, arthritis/joint pain, back pain, blood in urine, breast changes/lumps, change in vision, confusion, cough, coughing up blood, depression-new, fainting, fatigue, fever, headaches-new, hearing problems, heart murmur, heart rhythm changes, itching, menstrual pain, muscle pains/cramps, night sweats, nosebleeds, pregnancy symptoms, shortness of breath, skin rash, sleeping problems, sore throat, swelling of feet/legs, swollen lymph glands, thirst - excessive , urination - excessive , urination changes/pain, urine leakage, vision changes, and voice change.         All other systems were reviewed and were negative   Vital Signs:  Patient profile:   25year old male Height:      72 inches Weight:      140 pounds BMI:     19.06 BSA:     1.83 Pulse rate:   88 / minute Pulse rhythm:   regular BP sitting:   110 / 64  (left arm) Cuff size:   regular  Vitals Entered By: Jeffrey PigeonCMA (April 12, 2010 2:37 PM)  Physical Exam  Additional Exam:  On physical exam he is a well-developed male  skin: anicteric HEENT: normocephalic; PEERLA; no nasal or pharyngeal abnormalities neck: supple nodes: no  cervical lymphadenopathy chest: clear to ausculatation and percussion heart: no murmurs, gallops, or rubs abd: soft,  nontender; BS normoactive; no abdominal masses, tenderness, organomegaly rectal: deferred ext: no cynanosis, clubbing, edema skeletal: no deformities neuro: oriented x 3; no focal abnormalities    Impression & Recommendations:  Problem # 1:  ULCERATIVE COLITIS (ICD-556.9) He remains in clinical remission on Imuran only.  He has a history of pancreatitis presumably secondary to lialda.  Recommendations #1 continue Imuran #2 check LFTs and CBC every 6 months  Patient Instructions: 1)  Copy sent to : Jeffrey Jenkins, M.D. 2)  You will follow up with labs in 6 months and 1 year. 3)  You will need to follow up again with Dr Jeffrey Jenkins again in 1 year 4)  The medication list was reviewed and reconciled.  All changed / newly prescribed medications were explained.  A complete medication list was provided to the patient / caregiver.

## 2010-05-26 ENCOUNTER — Telehealth: Payer: Self-pay | Admitting: Gastroenterology

## 2010-05-27 ENCOUNTER — Encounter: Payer: Self-pay | Admitting: Gastroenterology

## 2010-06-03 NOTE — Progress Notes (Signed)
Summary: Refill  Phone Note Refill Request Message from:  Fax from Pharmacy on May 26, 2010 4:36 PM  Refills Requested: Medication #1:  AZATHIOPRINE 50 MG TABS Take 2 1/2  tablets daily (169m)   Dosage confirmed as above?Dosage Confirmed   Brand Name Necessary? No   Supply Requested: 6 months    Prescriptions: AZATHIOPRINE 50 MG TABS (AZATHIOPRINE) Take 2 1/2  tablets daily (1282m  #90 x 6   Entered by:   RoGenella MechMA (AAV[place]  Authorized by:   RoInda CastleD   Signed by:   RoGenella MechMA (AAPetersburgon 05/26/2010   Method used:   [facility]ically to        [facility]       [address]     [place]  [id number]     Ph: [phone]     Fax: [phone] RxID: :   [id number]

## 2010-08-10 NOTE — H&P (Signed)
NAME:  Jeffrey Jenkins, Jeffrey Jenkins NO.:  [id number]   MEDICAL RECORD NO.:  [id number]          PATIENT TYPE:  INP   LOCATION:  [place]                         FACILITY:  [facility]   PHYSICIAN:  Sandy Salaam. Deatra Ina, MD,FACGDATE OF BIRTH:  09/27/1985   DATE OF ADMISSION:  10/29/2007  DATE OF DISCHARGE:                              HISTORY & PHYSICAL   CHIEF COMPLAINT:  Bloody diarrhea and abdominal pain with weight loss.   HISTORY:  Zyair is a 25 year old white male known to Dr. Verl Blalock, newly diagnosed with left-sided ulcerative colitis in July  2009.  Initially, he had some favorable response to Lialda 2.4 gm  b.i.d., but at this point has relapsed with multiple loose bloody bowel  movements over the past few days.  He is having at least 10-12 bowel  movements per day, all of them associated with hematochezia.  He had  also had some fever at home and complaints of lower and mid abdominal  pain.  He has also had a progressive weight loss secondary to diarrhea  over the past month, apparently has lost 16 pounds.  His appetite is  good.  He is not having any vomiting.  He had presented to the [facility]  emergency room twice in the week before this admission with complaints  of left upper quadrant pain and nausea, he did have a CT of the abdomen  and pelvis done which was unrevealing, however, his lipase was in the  high 300s.  His LFTs were normal.  His abdominal pain now is in the  upper abdomen and radiated to his back.  He did not have any alleviating  factors.  He was given a trial of Ultram with improvement.  At this  point, the upper abdominal pain has improved, but the diarrhea and lower  abdominal pain continue.  He is admitted for more aggressive supportive  medical management for refractory ulcerative colitis.  He probably also  did have a mild pancreatitis secondary to mesalamine.   CURRENT MEDICATIONS:  1. Lialda 2 p.o. b.i.d.  2. Canasa suppository b.i.d. p.r.n.  3.  Ultram p.r.n.  4. Iron supplement daily.   ALLERGIES:  NO KNOWN DRUG ALLERGIES, THOUGH AT THIS POINT WOULD CONSIDER  MESALAMINE INDUCED PANCREATITIS.   PAST HISTORY:  Unremarkable other than seasonal allergies.   FAMILY HISTORY:  Negative for inflammatory bowel disease, colon cancer,  colon polyps or other GI diseases.   SOCIAL HISTORY:  The patient is engaged.  He is a nonsmoker and  nondrinker.   REVIEW OF SYSTEMS:  HEENT:  Pertinent for cough with occasional pale  yellow sputum.  He does complain of chronic allergy symptoms.  CONSTITUTIONAL:  As outlined above with fever over the past few days,  weight loss of 16 pounds over the past a month.  CARDIAC:  Denies any  chest pain or anginal symptoms.  PULMONARY:  No shortness of breath,  cough as above.  SKIN:  He has not had any skin manifestations of an  inflammatory bowel disease.  No rash or other lesions.  MUSCULOSKELETAL:  No joint  pains, myalgias or pain.  NEURO:  Negative.  GU: Negative.  All  other review of systems negative.   PHYSICAL EXAMINATION:  GENERAL:  A well-developed young white male,  thin, anxious in no acute distress.  VITAL SIGNS:  Temperature 98.2, blood pressure 109/85, pulse is 81.  O2  sat 99 on room air.  HEENT:  Nontraumatic, normocephalic.  EOMI, PERRLA.  Sclerae are anicteric.  Conjunctivae pink.  NECK:  Supple without nodes.  CARDIOVASCULAR:  Regular rate and rhythm with S1-S2.  No murmur, rub or  gallop.  PULMONARY:  Clear to A&P.  ABDOMEN:  Soft, flat.  He has hyperactive bowel sounds.  He is tender in  the left lower quadrant.  There is no palpable mass or organomegaly.  No  guarding or rebound.  RECTAL:  Not done on admission.  EXTREMITIES:  Without clubbing, cyanosis or edema.  SKIN:  Unremarkable.   LABORATORY DATA:  Labs on admission; WBC of 10.4, hemoglobin 12.6,  hematocrit of 36.6, platelets 546, sed rate is 41.  Electrolytes within  normal limits.  BUN 3, creatinine 0.75, total  bilirubin of 1.3, indirect  bilirubin 1.1.  LFTs otherwise normal.  Lipase was 206.   IMPRESSION:  43. A 25 year old white male with newly diagnosed left-sided ulcerative      colitis, refractory to outpatient management.  2. Mild pancreatitis by chemistries, rule out mesalamine induced      pancreatitis.  3. Weight loss secondary to #1.   PLAN:  The patient will be admitted to the service of Dr. Erskine Emery  for IV fluid hydration, bowel rest.  Will start IV steroids, discontinue  mesalamine.  Check stool for C diff and cultures.  For further details,  please see the orders.      Amy Esterwood, PA-C      Robert D. Deatra Ina, MD,FACG  Electronically Signed    AE/MEDQ  D:  11/01/2007  T:  11/01/2007  Job:  [id number]   cc:   Loralee Pacas. Sharlett Iles, MD, FACG, FACP, FAGA  [address]  [place] [address]

## 2010-08-10 NOTE — Assessment & Plan Note (Signed)
[place]                                 ON-CALL NOTE   BEXTON, HAAK                          MRN:          [id number]  DATE:10/08/2007                            DOB:          07/22/1985    Donnita Falls called in reference to her son.  I think it is Sandi Mariscal,  who underwent a colonoscopy earlier in the day.  When he got up this  morning to go to work, he had some nausea and vomiting.  He denies  abdominal pain.  Temperature is 99.1.   I instructed them to try eating again about in an hour and to call the  office should he develop abdominal pain.  I do not think any further  workup is required at this time.     Sandy Salaam. Deatra Ina, MD,FACG  Electronically Signed    RDK/MedQ  DD: 10/09/2007  DT: 10/09/2007  Job #: [id number]

## 2010-08-10 NOTE — Discharge Summary (Signed)
NAME:  Jeffrey Jenkins, LAMP NO.:  [id number]   MEDICAL RECORD NO.:  [id number]          PATIENT TYPE:  INP   LOCATION:  [place]                         FACILITY:  [facility]   PHYSICIAN:  Gatha Mayer, MD,FACGDATE OF BIRTH:  [birth year]1984   DATE OF ADMISSION:  10/29/2007  DATE OF DISCHARGE:  11/03/2007                               DISCHARGE SUMMARY   DISCHARGE DIAGNOSES:  1. Ulcerative colitis.  2. Pancreatitis.   DISCHARGE MEDICATIONS:  1. Ultram 50 mg 1 to 2 tablets every 6 hours as needed.  2. Prednisone 40 mg daily.  3. Cortenema insert 1 nightly.  4. Robinul Forte 2 mg b.i.d. p.r.n.   DISPOSITION:  The patient stable upon discharge.  He was discharged  home.   CONSULTATIONS:  None requested.   PROCEDURES:  None performed this admission.   HOSPITAL COURSE:  Mr. Jeffrey Jenkins was admitted October 29, 2007 for abdominal  pain and bloody diarrhea.  The patient had recently been diagnosed with  ulcerative colitis by Dr. Verl Blalock in our office.  While  initially the patient doing okay on Lialda, he relapsed over the few  days prior to this admission.  Prior to being admitted to the hospital,  the patient had been seen in the emergency department and given pain  medication.  CT of the abdomen and pelvis was unremarkable in the  emergency department, but a lipase was elevated at 366.  The patient's  LFTs were unremarkable, as was his WBC.  The patient was admitted to the  hospital and started on IV steroids.  Lipase was elevated at 366.  In  the emergency department, his CBC was unremarkable.  Given the patient's  continued abdominal pain, bloody diarrhea, and significant weight loss,  the patient was admitted to the hospital for IV fluids and IV steroids.  Stool studies, including clostridium difficile were negative.  The  patient continued to have bloody diarrhea throughout the first few days  of his admission with an H&H remaining stable and dropped only to 11.2  and  32.  After a few days into the admission, the patient was started on  Canasa suppositories.  By the third or fourth day, the patient was  feeling much better with less diarrhea.  He was kept on liquid for the  first part of this admission due to an elevated lipase.  It was felt  that the [place] may have caused acute pancreatitis in this patient.  There was no evidence of gall stones on the CT, and the patient had no  history of alcohol use.  One day prior to admission, the patient was  advanced to a low-residue lactose-free diet, which he tolerated well.  He was discharged home on 40 mg of prednisone.  The patient was asked to  follow up.   DISCHARGE DIAGNOSES:  1. Ulcerative colitis.  2. Pancreatitis.   DISCHARGE MEDICATIONS:  1. Prednisone 40 mg daily.  2. Cortenema nightly.  3. Ultram (home medication).  4. Robinul Forte 2 mg b.i.d. p.r.n.   DISPOSITION:  The patient was discharged home in stable condition.  CONSULTATIONS:  None requested this admission.   PROCEDURES:  None performed this admission.   HOSPITAL COURSE:  Mr. Jeffrey Jenkins was admitted October 29, 2007 with abdominal  pain, weight loss, bloody diarrhea, and a recent diagnosis of ulcerative  colitis.  The patient was followed in our office by Dr. Sharlett Jenkins.  He  recently started Lialda and seemed to be doing well initially, but then  had recurrent bloody diarrhea 2 days ago.  The patient came to the  emergency department twice in the last several days.  A CT of the  abdomen and pelvis done in the ER was unremarkable except for mild  distension of the small and large bowels with air-fluid levels.  The  patient's CBC in the emergency department was unremarkable.  Lipase was  elevated at 366, however.  The patient was complaining of mid upper  abdominal pain at the time.  He was seen in the emergency department.  His second visit to the emergency department again showed an elevated  lipase at 392.  The patient was admitted  to our service for further  evaluation and treatment.  He was started on IV steroids and his Lialda  was discontinued, as it was felt to possibly be the cause of acute  pancreatitis.  For the first few days of this admission, the patient  continued to have bloody diarrhea with abdominal pain that subsided  fairly quickly without the use of narcotics.  Clostridium difficile  studies were negative for this admission.  The patient was initially  given a clear liquid diet.  He was advanced to full liquids on the third  day of admission.  On the fourth day of admission, he was advanced to a  low-residue lactose-free diet, which he tolerated well.  Mr. Jeffrey Jenkins  remained afebrile with a normal white count throughout this admission.  He is discharged home on prednisone and given an appointment to follow  up with Dr. Deatra Jenkins (at the patient's request).  Prednisone.  The patient  was to follow up with Dr. Deatra Jenkins in our office (per the patient's  request).      Jeffrey Savoy, NP      Gatha Mayer, MD,FACG  Electronically Signed    PG/MEDQ  D:  12/05/2007  T:  12/05/2007  Job:  [id number]

## 2010-12-24 LAB — DIFFERENTIAL
Basophils Absolute: 0.1
Basophils Absolute: 0
Basophils Relative: 1
Basophils Relative: 0
Eosinophils Absolute: 0.7
Eosinophils Absolute: 0.7
Eosinophils Relative: 8 — ABNORMAL HIGH
Lymphocytes Relative: 9 — ABNORMAL LOW
Lymphs Abs: 1.3
Monocytes Absolute: 0.5
Monocytes Absolute: 0.7
Monocytes Relative: 5
Neutrophils Relative %: 74

## 2010-12-24 LAB — BASIC METABOLIC PANEL
BUN: 7
CO2: 26
Calcium: 8.7
Chloride: 105
Creatinine, Ser: 0.75
Creatinine, Ser: 0.75
Glucose, Bld: 115 — ABNORMAL HIGH
Glucose, Bld: 156 — ABNORMAL HIGH

## 2010-12-24 LAB — URINALYSIS, ROUTINE W REFLEX MICROSCOPIC
Bilirubin Urine: NEGATIVE
Hgb urine dipstick: NEGATIVE
Nitrite: NEGATIVE
Specific Gravity, Urine: 1.028
Specific Gravity, Urine: 1.007
Urobilinogen, UA: 0.2
Urobilinogen, UA: 0.2
pH: 5.5

## 2010-12-24 LAB — CBC
Hemoglobin: 12.6 — ABNORMAL LOW
Hemoglobin: 13.4
MCHC: 33.5
MCHC: 34.3
MCV: 89.2
MCV: 88.5
MCV: 89.3
Platelets: 409 — ABNORMAL HIGH
RBC: 4.14 — ABNORMAL LOW
RBC: 4.49
RDW: 12.5
RDW: 12.6
WBC: 11.1 — ABNORMAL HIGH

## 2010-12-24 LAB — HEPATIC FUNCTION PANEL
Albumin: 3 — ABNORMAL LOW
Bilirubin, Direct: 0.2
Indirect Bilirubin: 0.8
Total Bilirubin: 1.3 — ABNORMAL HIGH
Total Protein: 6.4

## 2010-12-24 LAB — URINE MICROSCOPIC-ADD ON

## 2010-12-24 LAB — CLOSTRIDIUM DIFFICILE EIA
C difficile Toxins A+B, EIA: NEGATIVE
C difficile Toxins A+B, EIA: NEGATIVE

## 2010-12-24 LAB — COMPREHENSIVE METABOLIC PANEL
ALT: 21
AST: 19
Albumin: 3 — ABNORMAL LOW
Alkaline Phosphatase: 60
Chloride: 105
Creatinine, Ser: 0.77
GFR calc Af Amer: >60
Potassium: 3.9
Sodium: 134 — ABNORMAL LOW
Total Bilirubin: 1.6 — ABNORMAL HIGH

## 2010-12-24 LAB — HEMOGLOBIN AND HEMATOCRIT, BLOOD
HCT: 34.2 — ABNORMAL LOW
Hemoglobin: 11.1 — ABNORMAL LOW
Hemoglobin: 11.3 — ABNORMAL LOW

## 2010-12-24 LAB — POCT I-STAT, CHEM 8
BUN: 10
Creatinine, Ser: 1.1
Glucose, Bld: 92
Hemoglobin: 11.9 — ABNORMAL LOW
TCO2: 24

## 2010-12-24 LAB — C-REACTIVE PROTEIN: CRP: 5.4 — ABNORMAL HIGH (ref <0.6)

## 2010-12-24 LAB — LIPASE, BLOOD
Lipase: 366 — ABNORMAL HIGH
Lipase: 206 — ABNORMAL HIGH

## 2010-12-24 LAB — STOOL CULTURE

## 2011-01-28 ENCOUNTER — Telehealth: Payer: Self-pay | Admitting: *Deleted

## 2011-01-28 NOTE — Telephone Encounter (Signed)
Received request for Azathiprine, pt is past due on labs....... Needs CBC and LFT's.. No Answer when I tried to contact pt. Sent authorization for 1 refill

## 2011-02-01 ENCOUNTER — Telehealth: Payer: Self-pay | Admitting: Gastroenterology

## 2011-02-01 NOTE — Telephone Encounter (Signed)
Pt scheduled for OV with Dr. Deatra Ina 02/09/11@2 :45pm. Pt aware of appt date and time. States he called to get refill on medication and was told he needed an OV.

## 2011-02-02 NOTE — Telephone Encounter (Signed)
Pt scheduled appointment with Dr Deatra Ina for 11/14

## 2011-02-09 ENCOUNTER — Other Ambulatory Visit (INDEPENDENT_AMBULATORY_CARE_PROVIDER_SITE_OTHER): Payer: BC Managed Care – PPO

## 2011-02-09 ENCOUNTER — Encounter: Payer: Self-pay | Admitting: Gastroenterology

## 2011-02-09 ENCOUNTER — Ambulatory Visit (INDEPENDENT_AMBULATORY_CARE_PROVIDER_SITE_OTHER): Payer: BC Managed Care – PPO | Admitting: Gastroenterology

## 2011-02-09 DIAGNOSIS — K5190 Ulcerative colitis, unspecified, without complications: Secondary | ICD-10-CM

## 2011-02-09 LAB — CBC WITH DIFFERENTIAL/PLATELET
Basophils Absolute: 0 10*3/uL (ref 0.0–0.1)
Basophils Relative: 0.3 % (ref 0.0–3.0)
Eosinophils Absolute: 0.2 10*3/uL (ref 0.0–0.7)
HCT: 44.2 % (ref 39.0–52.0)
Hemoglobin: 15.1 g/dL (ref 13.0–17.0)
Lymphocytes Relative: 22.9 % (ref 12.0–46.0)
Lymphs Abs: 1.5 10*3/uL (ref 0.7–4.0)
MCHC: 34.2 g/dL (ref 30.0–36.0)
MCV: 94.9 fl (ref 78.0–100.0)
Monocytes Absolute: 0.4 10*3/uL (ref 0.1–1.0)
Neutro Abs: 4.3 10*3/uL (ref 1.4–7.7)
RDW: 15 % — ABNORMAL HIGH (ref 11.5–14.6)

## 2011-02-09 LAB — HEPATIC FUNCTION PANEL
Albumin: 4.6 g/dL (ref 3.5–5.2)
Total Protein: 7.3 g/dL (ref 6.0–8.3)

## 2011-02-09 MED ORDER — AZATHIOPRINE 50 MG PO TABS: 50.0000 mg | ORAL_TABLET | Freq: Every day | ORAL | Status: DC

## 2011-02-09 NOTE — Assessment & Plan Note (Signed)
He continues in clinical remission on Imuran.  Medications #1 continue Imuran #2 check CBC and LFTs every 6 months

## 2011-02-09 NOTE — Patient Instructions (Signed)
You will go to the basement today for labs You will follow up with labs in 6 months You will need to schedule a follow up appointment in 1 year We will renew all medications today

## 2011-02-09 NOTE — Progress Notes (Signed)
History of Present Illness:  Jeffrey Jenkins is a 25 year old white male with history of ulcerative colitis here for yearly checkup. He is maintained on Imuran and has continued in clinical remission. In the past he developed pancreatitis with mesalamine. He has no GI complaints.    Review of Systems: Pertinent positive and negative review of systems were noted in the above HPI section. All other review of systems were otherwise negative.    Current Medications, Allergies, Past Medical History, Past Surgical History, Family History and Social History were reviewed in [place] record  Vital signs were reviewed in today's medical record. Physical Exam: General: Well developed , well nourished, no acute distress Head: Normocephalic and atraumatic Eyes:  sclerae anicteric, EOMI Ears: Normal auditory acuity Mouth: No deformity or lesions Lungs: Clear throughout to auscultation Heart: Regular rate and rhythm; no murmurs, rubs or bruits Abdomen: Soft, non tender and non distended. No masses, hepatosplenomegaly or hernias noted. Normal Bowel sounds Rectal:deferred Musculoskeletal: Symmetrical with no gross deformities  Pulses:  Normal pulses noted Extremities: No clubbing, cyanosis, edema or deformities noted Neurological: Alert oriented x 4, grossly nonfocal Psychological:  Alert and cooperative. Normal mood and affect

## 2011-02-10 ENCOUNTER — Encounter: Payer: Self-pay | Admitting: Gastroenterology

## 2011-06-29 ENCOUNTER — Encounter (HOSPITAL_COMMUNITY): Payer: Self-pay

## 2011-06-29 ENCOUNTER — Emergency Department (HOSPITAL_COMMUNITY)
Admission: EM | Admit: 2011-06-29 | Discharge: 2011-06-29 | Disposition: A | Discharge status: Home / Self Care | Payer: BC Managed Care – PPO | Attending: Emergency Medicine | Admitting: Emergency Medicine

## 2011-06-29 DIAGNOSIS — R231 Pallor: Secondary | ICD-10-CM | POA: Insufficient documentation

## 2011-06-29 DIAGNOSIS — R109 Unspecified abdominal pain: Secondary | ICD-10-CM | POA: Insufficient documentation

## 2011-06-29 DIAGNOSIS — R197 Diarrhea, unspecified: Secondary | ICD-10-CM | POA: Insufficient documentation

## 2011-06-29 DIAGNOSIS — R51 Headache: Secondary | ICD-10-CM | POA: Insufficient documentation

## 2011-06-29 DIAGNOSIS — R1110 Vomiting, unspecified: Secondary | ICD-10-CM | POA: Insufficient documentation

## 2011-06-29 LAB — POCT I-STAT, CHEM 8
Creatinine, Ser: 1.1 mg/dL (ref 0.50–1.35)
Glucose, Bld: 114 mg/dL — ABNORMAL HIGH (ref 70–99)
Hemoglobin: 16.7 g/dL (ref 13.0–17.0)
Potassium: 4.7 mEq/L (ref 3.5–5.1)
TCO2: 25 mmol/L (ref 0–100)

## 2011-06-29 LAB — CBC
HCT: 46.2 % (ref 39.0–52.0)
Hemoglobin: 16.3 g/dL (ref 13.0–17.0)
WBC: 11.2 10*3/uL — ABNORMAL HIGH (ref 4.0–10.5)

## 2011-06-29 MED ORDER — ONDANSETRON HCL 4 MG/2ML IJ SOLN
4.0000 mg | Freq: Once | INTRAMUSCULAR | Status: AC
  Administered 2011-06-29: 4 mg via INTRAVENOUS
  Filled 2011-06-29: qty 2

## 2011-06-29 MED ORDER — ONDANSETRON HCL 4 MG PO TABS: 4.0000 mg | ORAL_TABLET | Freq: Four times a day (QID) | ORAL | Status: AC

## 2011-06-29 MED ORDER — SODIUM CHLORIDE 0.9 % IV BOLUS (SEPSIS)
1000.0000 mL | Freq: Once | INTRAVENOUS | Status: AC
  Administered 2011-06-29: 1000 mL via INTRAVENOUS

## 2011-06-29 MED ORDER — ONDANSETRON HCL 4 MG PO TABS: 4.0000 mg | ORAL_TABLET | Freq: Four times a day (QID) | ORAL | Status: DC

## 2011-06-29 MED ORDER — ONDANSETRON 8 MG PO TBDP: 8.0000 mg | ORAL_TABLET | Freq: Three times a day (TID) | ORAL | Status: DC | PRN

## 2011-06-29 MED ORDER — ACETAMINOPHEN 325 MG PO TABS
650.0000 mg | ORAL_TABLET | Freq: Once | ORAL | Status: AC
  Administered 2011-06-29: 650 mg via ORAL
  Filled 2011-06-29: qty 2

## 2011-06-29 MED ORDER — SODIUM CHLORIDE 0.9 % IV SOLN
Freq: Once | INTRAVENOUS | Status: AC
  Administered 2011-06-29: 05:00:00 via INTRAVENOUS

## 2011-06-29 MED ORDER — MORPHINE SULFATE 2 MG/ML IJ SOLN
2.0000 mg | Freq: Once | INTRAMUSCULAR | Status: AC
  Administered 2011-06-29: 2 mg via INTRAVENOUS
  Filled 2011-06-29: qty 1

## 2011-06-29 NOTE — ED Notes (Signed)
Pt given ginger ale to drink.

## 2011-06-29 NOTE — Discharge Instructions (Signed)
Diet for Diarrhea, Adult Having frequent, runny stools (diarrhea) has many causes. Diarrhea may be caused or worsened by food or drink. Diarrhea may be relieved by changing your diet. IF YOU ARE NOT TOLERATING SOLID FOODS:  Drink enough water and fluids to keep your urine clear or pale yellow.   Avoid sugary drinks and sodas as well as milk-based beverages.   Avoid beverages containing caffeine and alcohol.   You may try rehydrating beverages. You can make your own by following this recipe:    tsp table salt.    tsp baking soda.   ? tsp salt substitute (potassium chloride).   1 tbs + 1 tsp sugar.   1 qt water.  As your stools become more solid, you can start eating solid foods. Add foods one at a time. If a certain food causes your diarrhea to get worse, avoid that food and try other foods. A low fiber, low-fat, and lactose-free diet is recommended. Small, frequent meals may be better tolerated.  Starches  Allowed:  White, [place], and pita breads, plain rolls, buns, bagels. Plain muffins, matzo. Soda, saltine, or graham crackers. Pretzels, melba toast, zwieback. Cooked cereals made with water: cornmeal, farina, cream cereals. Dry cereals: refined corn, wheat, rice. Potatoes prepared any way without skins, refined macaroni, spaghetti, noodles, refined rice.   Avoid:  Bread, rolls, or crackers made with whole wheat, multi-grains, rye, bran seeds, nuts, or coconut. Corn tortillas or taco shells. Cereals containing whole grains, multi-grains, bran, coconut, nuts, or raisins. Cooked or dry oatmeal. Coarse wheat cereals, granola. Cereals advertised as "high-fiber." Potato skins. Whole grain pasta, wild or brown rice. Popcorn. Sweet potatoes/yams. Sweet rolls, doughnuts, waffles, pancakes, sweet breads.  Vegetables  Allowed: Strained tomato and vegetable juices. Most well-cooked and canned vegetables without seeds. Fresh: Tender lettuce, cucumber without the skin, cabbage, spinach, bean  sprouts.   Avoid: Fresh, cooked, or canned: Artichokes, baked beans, beet greens, broccoli, Brussels sprouts, corn, kale, legumes, peas, sweet potatoes. Cooked: Green or red cabbage, spinach. Avoid large servings of any vegetables, because vegetables shrink when cooked, and they contain more fiber per serving than fresh vegetables.  Fruit  Allowed: All fruit juices except prune juice. Cooked or canned: Apricots, applesauce, cantaloupe, cherries, fruit cocktail, grapefruit, grapes, kiwi, mandarin oranges, peaches, pears, plums, watermelon. Fresh: Apples without skin, ripe banana, grapes, cantaloupe, cherries, grapefruit, peaches, oranges, plums. Keep servings limited to  cup or 1 piece.   Avoid: Fresh: Apple with skin, apricots, mango, pears, raspberries, strawberries. Prune juice, stewed or dried prunes. Dried fruits, raisins, dates. Large servings of all fresh fruits.  Meat and Meat Substitutes  Allowed: Ground or well-cooked tender beef, ham, veal, lamb, pork, or poultry. Eggs, plain cheese. Fish, oysters, shrimp, lobster, other seafoods. Liver, organ meats.   Avoid: Tough, fibrous meats with gristle. Peanut butter, smooth or chunky. Cheese, nuts, seeds, legumes, dried peas, beans, lentils.  Milk  Allowed: Yogurt, lactose-free milk, kefir, drinkable yogurt, buttermilk, soy milk.   Avoid: Milk, chocolate milk, beverages made with milk, such as milk shakes.  Soups  Allowed: Bouillon, broth, or soups made from allowed foods. Any strained soup.   Avoid: Soups made from vegetables that are not allowed, cream or milk-based soups.  Desserts and Sweets  Allowed: Sugar-free gelatin, sugar-free frozen ice pops made without sugar alcohol.   Avoid: Plain cakes and cookies, pie made with allowed fruit, pudding, custard, cream pie. Gelatin, fruit, ice, sherbet, frozen ice pops. Ice cream, ice milk without nuts. Plain hard candy,  honey, jelly, molasses, syrup, sugar, chocolate syrup, gumdrops,  marshmallows.  Fats and Oils  Allowed: Avoid any fats and oils.   Avoid: Seeds, nuts, olives, avocados. Margarine, butter, cream, mayonnaise, salad oils, plain salad dressings made from allowed foods. Plain gravy, crisp bacon without rind.  Beverages  Allowed: Water, decaffeinated teas, oral rehydration solutions, sugar-free beverages.   Avoid: Fruit juices, caffeinated beverages (coffee, tea, soda or pop), alcohol, sports drinks, or lemon-lime soda or pop.  Condiments  Allowed: Ketchup, mustard, horseradish, vinegar, cream sauce, cheese sauce, cocoa powder. Spices in moderation: allspice, basil, bay leaves, celery powder or leaves, cinnamon, cumin powder, curry powder, ginger, mace, marjoram, onion or garlic powder, oregano, paprika, parsley flakes, ground pepper, rosemary, sage, savory, tarragon, thyme, turmeric.   Avoid: Coconut, honey.  Weight Monitoring: Weigh yourself every day. You should weigh yourself in the morning after you urinate and before you eat breakfast. Wear the same amount of clothing when you weigh yourself. Record your weight daily. Bring your recorded weights to your clinic visits. Tell your caregiver right away if you have gained 3 lb/1.4 kg or more in 1 day, 5 lb/2.3 kg in a week, or whatever amount you were told to report. SEEK IMMEDIATE MEDICAL CARE IF:   You are unable to keep fluids down.   You start to throw up (vomit) or diarrhea keeps coming back (persistent).   Abdominal pain develops, increases, or can be felt in one place (localizes).   You have an oral temperature above 102 F (38.9 C), not controlled by medicine.   Diarrhea contains blood or mucus.   You develop excessive weakness, dizziness, fainting, or extreme thirst.  MAKE SURE YOU:   Understand these instructions.   Will watch your condition.   Will get help right away if you are not doing well or get worse.  Document Released: 06/04/2003 Document Revised: 03/03/2011 Document Reviewed:  09/25/2008 [facility] Patient Information 2012 [place]. Follow Bratt diet for the next 24-48 hours

## 2011-06-29 NOTE — ED Provider Notes (Signed)
9:25 AM Complains of vomiting diarrhea and headache and subjective fever onset last night. Feels much improved since treatment in the emergency department patient able to drink without difficulty feels ready to go home  Orlie Dakin, MD 06/29/11 0930

## 2011-06-29 NOTE — ED Notes (Signed)
Pt complains of a headache that started at 8pm, then after going to bed he started vomiting and having diarrhea

## 2011-06-29 NOTE — ED Provider Notes (Signed)
History     CSN: [id number]  Arrival date & time 06/29/11  8938   First MD Initiated Contact with Patient 06/29/11 0455      Chief Complaint  Patient presents with  . Nausea  . Emesis  . Diarrhea  . Headache    (Consider location/radiation/quality/duration/timing/severity/associated sxs/prior treatment) HPI Comments: She's had nausea, vomiting, and diarrhea for the past 18 hours.  Coworkers have been ill with the same thing over the last 3 or 4 days  Patient is a 26 y.o. male presenting with vomiting, diarrhea, and headaches. The history is provided by the patient.  Emesis  This is a new problem. The current episode started yesterday. The problem occurs more than 10 times per day. The emesis has an appearance of bilious material. There has been no fever. Associated symptoms include abdominal pain, diarrhea and headaches. Pertinent negatives include no fever.  Diarrhea The primary symptoms include abdominal pain, vomiting and diarrhea. Primary symptoms do not include fever.  Headache  Associated symptoms include vomiting. Pertinent negatives include no fever.    Past Medical History  Diagnosis Date  . Environmental allergies   . Ulcerative colitis     Past Surgical History  Procedure Date  . Hernia repair     Family History  Problem Relation Age of Onset  . Heart attack Maternal Grandfather   . Pancreatic cancer Paternal Grandmother   . Alzheimer's disease Maternal Grandmother     History  Substance Use Topics  . Smoking status: Never Smoker   . Smokeless tobacco: Never Used  . Alcohol Use: No      Review of Systems  Constitutional: Negative for fever.  Gastrointestinal: Positive for vomiting, abdominal pain and diarrhea.  Neurological: Positive for headaches. Negative for dizziness and weakness.    Allergies  Mesalamine  Home Medications   Current Outpatient Rx  Name Route Sig Dispense Refill  . AZATHIOPRINE 50 MG PO TABS Oral Take 1 tablet (50 mg  total) by mouth daily. Take 2 1/2 tablets daily 75 tablet 11  . CETIRIZINE-PSEUDOEPHEDRINE ER 5-120 MG PO TB12 Oral Take 1 tablet by mouth as needed.      Marland Kitchen ONE-DAILY MULTI VITAMINS PO TABS Oral Take 1 tablet by mouth daily.        BP 116/62  Pulse 116  Temp(Src) 100 F (37.8 C) (Oral)  Resp 20  SpO2 98%  Physical Exam  Constitutional: He is oriented to person, place, and time. He appears well-developed and well-nourished.  HENT:  Head: Normocephalic.  Eyes: Pupils are equal, round, and reactive to light.  Neck: Normal range of motion.  Cardiovascular: Normal rate.   Abdominal: Soft. He exhibits no distension. There is no tenderness.  Musculoskeletal: Normal range of motion.  Neurological: He is alert and oriented to person, place, and time.  Skin: Skin is warm and dry. There is pallor.    ED Course  Procedures (including critical care time)   Labs Reviewed  CBC   No results found.   No diagnosis found.    MDM  Gastroenteritis/Norvasc        Garald Balding, NP 06/29/11 0505

## 2011-06-29 NOTE — ED Provider Notes (Signed)
Assumed pt care @ 0703. Norovirus? Awaits labs.  Plan to hydrates and dispo once tolerates PO  Patient with elevated BUN of 31 and normal creatinine, suggestive of pre-renal azotemia. We'll continues to hydrates via IVF. He has mildly elevated white count of 11.2. Patient also has a low-grade fever of 100F, Tylenol given.  Patient complained of headache. Headache is affecting his for head, throbbing with light sensitivity but no photophobia. No obvious meningismus sign. Patient does not look toxic. Plan to continue hydration, and symptomatically control  9:17 AM Pt felt better after treatment.  Request to be discharge.  Will prescribe antiemetic with strict f/u instruction.  Pt voice understanding and agrees with plan.    9:34 AM Pt felt better after 59m of morphine.  Able to tolerates PO.  My attending has seen and evaluate pt and felt he is safe to be discharged.    BDomenic Moras PA-C 06/29/11 [phone]

## 2011-06-29 NOTE — ED Notes (Signed)
Pt reports developing a headache around 8pm last night with nausea, vomiting, and diarrhea. Pt has a history of ulcerative colitis but does not think he is having a flare up. States he doesn't get a headache with a flare up. Came to the ER for further evaluation.

## 2011-06-29 NOTE — ED Provider Notes (Signed)
Medical screening examination/treatment/procedure(s) were performed by non-physician practitioner and as supervising physician I was immediately available for consultation/collaboration.  Kalman Drape, MD 06/29/11 [phone]

## 2011-06-30 NOTE — ED Provider Notes (Signed)
Medical screening examination/treatment/procedure(s) were performed by non-physician practitioner and as supervising physician I was immediately available for consultation/collaboration.  Kalman Drape, MD 06/30/11 [phone]

## 2012-01-12 ENCOUNTER — Encounter: Payer: Self-pay | Admitting: Gastroenterology

## 2012-01-13 ENCOUNTER — Ambulatory Visit (INDEPENDENT_AMBULATORY_CARE_PROVIDER_SITE_OTHER): Payer: BC Managed Care – PPO | Admitting: Family Medicine

## 2012-01-13 ENCOUNTER — Encounter: Payer: Self-pay | Admitting: Family Medicine

## 2012-01-13 VITALS — BP 104/70 | HR 100 | Temp 98.8°F | Ht 73.0 in | Wt 142.8 lb

## 2012-01-13 DIAGNOSIS — J40 Bronchitis, not specified as acute or chronic: Secondary | ICD-10-CM

## 2012-01-13 MED ORDER — AZITHROMYCIN 250 MG PO TABS: ORAL_TABLET | ORAL | Status: DC

## 2012-01-13 NOTE — Patient Instructions (Signed)
Tylenol for fever Use the codeine cough syrup at night - but let me know if this does not work  Avoid sudafed  mucinex DM - during the day  Take the zithromax as directed

## 2012-01-13 NOTE — Progress Notes (Signed)
Subjective:    Patient ID: Jeffrey Jenkins, male    DOB: Aug 07, 1985, 26 y.o.   MRN: [id number]  HPI  Here for uri symptoms   Came home with temp 99 that went up to 110.5 on wed  Chest congestion with cough Some st from coughing No nasal symptoms   No wheezing Non smoker  Phlegm is mostly clear to white   Temp is 98.8 right now - goes up at night No tylenol today   Very tired  Takes tylenol  Wife gave him cough med with codeine in it - did not actually help much  Took some mucinex and sudafed   Is on imuran right now   Patient Active Problem List  Diagnosis  . ALLERGIC RHINITIS  . ULCERATIVE COLITIS-LEFT SIDE  . INGROWN TOENAIL, INFECTED  . HEAT RASH  . NONSPECIFIC ABNORMAL RESULTS LIVR FUNCTION STUDY   Past Medical History  Diagnosis Date  . Environmental allergies   . Ulcerative colitis    Past Surgical History  Procedure Date  . Hernia repair    History  Substance Use Topics  . Smoking status: Never Smoker   . Smokeless tobacco: Never Used  . Alcohol Use: No   Family History  Problem Relation Age of Onset  . Heart attack Maternal Grandfather   . Pancreatic cancer Paternal Grandmother   . Alzheimer's disease Maternal Grandmother    Allergies  Allergen Reactions  . Mesalamine     REACTION: sick-- pancreatitis   Current Outpatient Prescriptions on File Prior to Visit  Medication Sig Dispense Refill  . azaTHIOprine (IMURAN) 50 MG tablet Take 1 tablet (50 mg total) by mouth daily. Take 2 1/2 tablets daily  75 tablet  11  . cetirizine-pseudoephedrine (ZYRTEC-D) 5-120 MG per tablet Take 1 tablet by mouth as needed.        . Multiple Vitamin (MULTIVITAMIN) tablet Take 1 tablet by mouth daily.           Review of Systems Review of Systems  Constitutional: Negative for fever, appetite change, fatigue and unexpected weight change ENT neg for facial/ sinus pain.  Eyes: Negative for pain and visual disturbance.  Respiratory: Negative for wheeze and  shortness of breath.   Cardiovascular: Negative for cp or palpitations    Gastrointestinal: Negative for nausea, diarrhea and constipation.  Genitourinary: Negative for urgency and frequency.  Skin: Negative for pallor or rash  neg for tick bite  Neurological: Negative for weakness, light-headedness, numbness and headaches.  Hematological: Negative for adenopathy. Does not bruise/bleed easily.  Psychiatric/Behavioral: Negative for dysphoric mood. The patient is not nervous/anxious.         Objective:   Physical Exam  Constitutional: He appears well-developed and well-nourished. No distress.  HENT:  Head: Normocephalic and atraumatic.  Right Ear: External ear normal.  Left Ear: External ear normal.  Mouth/Throat: Oropharynx is clear and moist. No oropharyngeal exudate.       Nares boggy with some post nasal drip  No sinus pain   Eyes: Conjunctivae normal and EOM are normal. Pupils are equal, round, and reactive to light. Right eye exhibits no discharge. Left eye exhibits no discharge.  Neck: Normal range of motion. Neck supple. No JVD present. Carotid bruit is not present. No thyromegaly present.  Cardiovascular: Normal rate and regular rhythm.   Pulmonary/Chest: Breath sounds normal. No respiratory distress. He has no wheezes. He has no rales.  Abdominal: Soft. Bowel sounds are normal.  Lymphadenopathy:    He has no  cervical adenopathy.  Neurological: He is alert.  Skin: Skin is warm and dry. No rash noted.  Psychiatric: He has a normal mood and affect.          Assessment & Plan:

## 2012-01-13 NOTE — Assessment & Plan Note (Signed)
Acute bronchitis with fever and mild headache  In light of pt's staus on imuran, will cover with zpak  Disc symptomatic care - see instructions on AVS  Update if not starting to improve in a week or if worsening

## 2012-03-04 ENCOUNTER — Other Ambulatory Visit: Payer: Self-pay | Admitting: Oncology

## 2012-03-07 ENCOUNTER — Other Ambulatory Visit: Payer: Self-pay | Admitting: Gastroenterology

## 2012-03-07 MED ORDER — AZATHIOPRINE 50 MG PO TABS: 50.0000 mg | ORAL_TABLET | Freq: Every day | ORAL | Status: DC

## 2012-03-07 NOTE — Telephone Encounter (Signed)
PT NEEDS AN OFFICE APPOINTMENT, SENT PRESCRIPTION TO PHARMACY

## 2012-04-04 ENCOUNTER — Other Ambulatory Visit: Payer: Self-pay | Admitting: Oncology

## 2012-04-16 ENCOUNTER — Other Ambulatory Visit (INDEPENDENT_AMBULATORY_CARE_PROVIDER_SITE_OTHER): Payer: BC Managed Care – PPO

## 2012-04-16 ENCOUNTER — Encounter: Payer: Self-pay | Admitting: Gastroenterology

## 2012-04-16 ENCOUNTER — Ambulatory Visit (INDEPENDENT_AMBULATORY_CARE_PROVIDER_SITE_OTHER): Payer: BC Managed Care – PPO | Admitting: Gastroenterology

## 2012-04-16 VITALS — BP 100/60 | HR 84 | Ht 72.5 in | Wt 145.5 lb

## 2012-04-16 DIAGNOSIS — K5190 Ulcerative colitis, unspecified, without complications: Secondary | ICD-10-CM

## 2012-04-16 DIAGNOSIS — K5150 Left sided colitis without complications: Secondary | ICD-10-CM

## 2012-04-16 LAB — HEPATIC FUNCTION PANEL
Albumin: 4.4 g/dL (ref 3.5–5.2)
Alkaline Phosphatase: 46 U/L (ref 39–117)

## 2012-04-16 LAB — CBC WITH DIFFERENTIAL/PLATELET
Basophils Absolute: 0 10*3/uL (ref 0.0–0.1)
Eosinophils Absolute: 0.3 10*3/uL (ref 0.0–0.7)
Hemoglobin: 14.9 g/dL (ref 13.0–17.0)
Lymphocytes Relative: 25.8 % (ref 12.0–46.0)
MCHC: 34.2 g/dL (ref 30.0–36.0)
Monocytes Absolute: 0.3 10*3/uL (ref 0.1–1.0)
Neutro Abs: 3.2 10*3/uL (ref 1.4–7.7)
RDW: 14.4 % (ref 11.5–14.6)

## 2012-04-16 NOTE — Assessment & Plan Note (Signed)
Patient remains in clinical remission.  Recommendations #1 continue Imuran #2 check CBC, LFTs every 6 months #3 check serologies for hepatitis A, B, and C and vaccinate accordingly

## 2012-04-16 NOTE — Progress Notes (Signed)
History of Present Illness:  Jeffrey Jenkins has returned for followup of left-sided colitis. He remains in clinical remission on Imuran alone. He has no GI complaints.    Review of Systems: Pertinent positive and negative review of systems were noted in the above HPI section. All other review of systems were otherwise negative.    Current Medications, Allergies, Past Medical History, Past Surgical History, Family History and Social History were reviewed in [facility] record  Vital signs were reviewed in today's medical record. Physical Exam: General: Well developed , well nourished, no acute distress Skin: anicteric; no obvious moles Head: Normocephalic and atraumatic Eyes:  sclerae anicteric, EOMI Ears: Normal auditory acuity Mouth: No deformity or lesions Lungs: Clear throughout to auscultation Heart: Regular rate and rhythm; no murmurs, rubs or bruits Abdomen: Soft, non tender and non distended. No masses, hepatosplenomegaly or hernias noted. Normal Bowel sounds Rectal:deferred Musculoskeletal: Symmetrical with no gross deformities  Pulses:  Normal pulses noted Extremities: No clubbing, cyanosis, edema or deformities noted Neurological: Alert oriented x 4, grossly nonfocal Psychological:  Alert and cooperative. Normal mood and affect

## 2012-04-16 NOTE — Patient Instructions (Addendum)
Go to the basement for labs You will need to follow up with labs in 6 months and a office visit in one year  CBC and LFT's

## 2012-04-17 LAB — HEPATITIS C ANTIBODY: HCV Ab: NEGATIVE

## 2012-04-17 LAB — HEPATITIS A ANTIBODY, IGM: Hep A IgM: NEGATIVE

## 2012-04-17 LAB — HEPATITIS B SURFACE ANTIGEN: Hepatitis B Surface Ag: NEGATIVE

## 2012-04-17 LAB — HEPATITIS B SURFACE ANTIBODY,QUALITATIVE: Hep B S Ab: REACTIVE — AB

## 2012-04-17 NOTE — Progress Notes (Signed)
Quick Note:  Correction, Hbsag positive; no need for vaccine ______

## 2012-08-21 ENCOUNTER — Ambulatory Visit (INDEPENDENT_AMBULATORY_CARE_PROVIDER_SITE_OTHER): Payer: BC Managed Care – PPO | Admitting: Family Medicine

## 2012-08-21 ENCOUNTER — Encounter: Payer: Self-pay | Admitting: Family Medicine

## 2012-08-21 VITALS — BP 90/60 | HR 78 | Temp 98.4°F | Ht 72.5 in | Wt 139.0 lb

## 2012-08-21 DIAGNOSIS — R509 Fever, unspecified: Secondary | ICD-10-CM | POA: Insufficient documentation

## 2012-08-21 MED ORDER — DOXYCYCLINE HYCLATE 100 MG PO TABS: 100.0000 mg | ORAL_TABLET | Freq: Two times a day (BID) | ORAL | Status: DC

## 2012-08-21 NOTE — Patient Instructions (Signed)
Start doxycycline for empiric treatment of tick borne illness. We will call you with labs results. Let us know if you are not feeling better, fever in 48-72 hours.

## 2012-08-21 NOTE — Progress Notes (Addendum)
  Subjective:    Patient ID: Jeffrey Jenkins, male    DOB: 10-Jan-1986, 27 y.o.   MRN: [id number]  HPI  27 year old male presents with  Chills, bodyache, flu like illness x 3 days.  Improved temporarily with tylenol.  Yesterday he felt dizzy, unsteady after mowing lawn.  Headache constant  In last 3 days. Mild neck stiffness and pain.  No joint swelling.  No rash.  He felt medium size tick attached on him 2- 3 weeks ago.    Review of Systems  Constitutional: Negative for fever, chills and fatigue.  HENT: Negative for ear pain.   Eyes: Negative for pain.  Respiratory: Negative for chest tightness and shortness of breath.   Cardiovascular: Negative for chest pain, palpitations and leg swelling.  Gastrointestinal: Negative for abdominal pain.       Objective:   Physical Exam  Constitutional: Vital signs are normal. He appears well-developed and well-nourished.  HENT:  Head: Normocephalic.  Right Ear: Hearing normal.  Left Ear: Hearing normal.  Nose: Nose normal.  Mouth/Throat: Oropharynx is clear and moist and mucous membranes are normal.  Neck: Trachea normal. Carotid bruit is not present. No mass and no thyromegaly present.  Cardiovascular: Normal rate, regular rhythm and normal pulses.  Exam reveals no gallop, no distant heart sounds and no friction rub.   No murmur heard. No peripheral edema  Pulmonary/Chest: Effort normal and breath sounds normal. No respiratory distress.  Skin: Skin is warm, dry and intact. No rash noted.  Psychiatric: He has a normal mood and affect. His speech is normal and behavior is normal. Thought content normal.   No cervical lymphadenopathy. No oropharyngeal erythema       Assessment & Plan:

## 2012-08-21 NOTE — Assessment & Plan Note (Signed)
Viral illness vs tick borne illness.  Given correct time course and symptom profile... Will treat empirically for tick borne illness while we wait for labs to return.

## 2012-08-22 LAB — B. BURGDORFI ANTIBODIES: B burgdorferi Ab IgG+IgM: 0.32 {ISR}

## 2012-08-23 ENCOUNTER — Encounter: Payer: Self-pay | Admitting: *Deleted

## 2012-10-22 ENCOUNTER — Other Ambulatory Visit: Payer: Self-pay | Admitting: *Deleted

## 2012-10-22 MED ORDER — AZATHIOPRINE 50 MG PO TABS: 50.0000 mg | ORAL_TABLET | Freq: Every day | ORAL | Status: DC

## 2012-12-21 ENCOUNTER — Telehealth: Payer: Self-pay | Admitting: *Deleted

## 2012-12-21 DIAGNOSIS — K5190 Ulcerative colitis, unspecified, without complications: Secondary | ICD-10-CM

## 2012-12-21 NOTE — Telephone Encounter (Signed)
Message copied by Oda Kilts on Fri Dec 21, 2012  3:13 PM ------      Message from: Oda Kilts      Created: Mon Apr 16, 2012  3:43 PM      Regarding: labs in 6 months       Patient needs 6 month labs and follow up in 1 year ------

## 2012-12-28 MED ORDER — AZATHIOPRINE 50 MG PO TABS: 50.0000 mg | ORAL_TABLET | Freq: Every day | ORAL | Status: DC

## 2012-12-28 NOTE — Telephone Encounter (Signed)
Patient coming in next week for labs and requested a refill in his Imuran sent in script today

## 2013-01-09 NOTE — Telephone Encounter (Signed)
Mailed patient letter today, no further refills until labs are drawn

## 2013-01-11 ENCOUNTER — Other Ambulatory Visit (INDEPENDENT_AMBULATORY_CARE_PROVIDER_SITE_OTHER): Payer: BC Managed Care – PPO

## 2013-01-11 DIAGNOSIS — K5190 Ulcerative colitis, unspecified, without complications: Secondary | ICD-10-CM

## 2013-01-11 LAB — CBC WITH DIFFERENTIAL/PLATELET
Basophils Relative: 0.3 % (ref 0.0–3.0)
Eosinophils Absolute: 0.1 10*3/uL (ref 0.0–0.7)
Eosinophils Relative: 2.1 % (ref 0.0–5.0)
HCT: 42.2 % (ref 39.0–52.0)
Hemoglobin: 14.5 g/dL (ref 13.0–17.0)
MCHC: 34.4 g/dL (ref 30.0–36.0)
MCV: 92.8 fl (ref 78.0–100.0)
Monocytes Absolute: 0.4 10*3/uL (ref 0.1–1.0)
Neutro Abs: 3.7 10*3/uL (ref 1.4–7.7)
RBC: 4.55 Mil/uL (ref 4.22–5.81)

## 2013-01-11 LAB — HEPATIC FUNCTION PANEL
ALT: 21 U/L (ref 0–53)
Albumin: 4.5 g/dL (ref 3.5–5.2)
Total Protein: 7.2 g/dL (ref 6.0–8.3)

## 2013-03-06 ENCOUNTER — Ambulatory Visit (INDEPENDENT_AMBULATORY_CARE_PROVIDER_SITE_OTHER): Payer: BC Managed Care – PPO | Admitting: Family Medicine

## 2013-03-06 ENCOUNTER — Encounter: Payer: Self-pay | Admitting: Family Medicine

## 2013-03-06 VITALS — BP 116/68 | HR 106 | Temp 99.9°F | Ht 74.0 in | Wt 141.1 lb

## 2013-03-06 DIAGNOSIS — R509 Fever, unspecified: Secondary | ICD-10-CM

## 2013-03-06 DIAGNOSIS — J111 Influenza due to unidentified influenza virus with other respiratory manifestations: Secondary | ICD-10-CM | POA: Insufficient documentation

## 2013-03-06 LAB — POCT INFLUENZA A/B: Influenza B, POC: NEGATIVE

## 2013-03-06 MED ORDER — HYDROCODONE-HOMATROPINE 5-1.5 MG/5ML PO SYRP: 5.0000 mL | ORAL_SOLUTION | Freq: Three times a day (TID) | ORAL | Status: DC | PRN

## 2013-03-06 MED ORDER — OSELTAMIVIR PHOSPHATE 75 MG PO CAPS: 75.0000 mg | ORAL_CAPSULE | Freq: Two times a day (BID) | ORAL | Status: DC

## 2013-03-06 NOTE — Progress Notes (Signed)
Pre-visit discussion using our clinic review tool. No additional management support is needed unless otherwise documented below in the visit note.  

## 2013-03-06 NOTE — Patient Instructions (Signed)
I think you may have the flu - you have classic symptoms despite your negative rapid flu screen today  Rest and stay  Out of work today and tomorrow  You can alternate tylenol and motrin for fever  Try hycodan  for cough   (you can also try mucinex DM otc)  Take tamiflu as directed  Update if not starting to improve in a week or if worsening

## 2013-03-06 NOTE — Progress Notes (Signed)
Subjective:    Patient ID: Jeffrey Jenkins, male    DOB: 03-Feb-1986, 27 y.o.   MRN: [id number]  HPI Jeffrey Jenkins for a week - got better over the weekend and early this week (fever went away and his cough was productive) Last night was much worse 103 fever -- came down to 100.5 with motrin   (his fever was not originally that high) Bad cough - not bringing much up - really dry cough  Sinuses are congested -not too bad  Had headache with fever-mild  Last sinus pressure was Saturday   Wife works at [employer]	[place] and has some cold like symptoms   He did get a flu vaccine this year   Patient Active Problem List   Diagnosis Date Noted  . Fever, unspecified 08/21/2012  . NONSPECIFIC ABNORMAL RESULTS LIVR FUNCTION STUDY 12/15/2008  . ULCERATIVE COLITIS-LEFT SIDE 10/11/2007  . ALLERGIC RHINITIS 08/02/2006   Past Medical History  Diagnosis Date  . Environmental allergies   . Ulcerative colitis    Past Surgical History  Procedure Laterality Date  . Hernia repair     History  Substance Use Topics  . Smoking status: Never Smoker   . Smokeless tobacco: Never Used  . Alcohol Use: No   Family History  Problem Relation Age of Onset  . Heart attack Maternal Grandfather   . Pancreatic cancer Paternal Grandmother   . Alzheimer's disease Maternal Grandmother    Allergies  Allergen Reactions  . Lialda [Mesalamine]     REACTION: sick-- pancreatitis   Current Outpatient Prescriptions on File Prior to Visit  Medication Sig Dispense Refill  . azaTHIOprine (IMURAN) 50 MG tablet Take 1 tablet (50 mg total) by mouth daily. Take 2 1/2 tablets daily  75 tablet  2  . cetirizine-pseudoephedrine (ZYRTEC-D) 5-120 MG per tablet Take 1 tablet by mouth as needed.        . Multiple Vitamin (MULTIVITAMIN) tablet Take 1 tablet by mouth daily.         No current facility-administered medications on file prior to visit.      Review of Systems Review of Systems  Constitutional: Negative for  and unexpected  weight change. pos for fatigue/ malaise and fever ENT pos for congestion and rhinorrhea  Eyes: Negative for pain and visual disturbance.  Respiratory: Negative for wheeze  and shortness of breath.   Cardiovascular: Negative for cp or palpitations    Gastrointestinal: Negative for nausea, diarrhea and constipation.  Genitourinary: Negative for urgency and frequency.  Skin: Negative for pallor or rash   Neurological: Negative for weakness, light-headedness, numbness and pos for headache  Hematological: Negative for adenopathy. Does not bruise/bleed easily.  Psychiatric/Behavioral: Negative for dysphoric mood. The patient is not nervous/anxious.         Objective:   Physical Exam  Constitutional: He appears well-developed and well-nourished. No distress.  HENT:  Head: Normocephalic and atraumatic.  Right Ear: External ear normal.  Left Ear: External ear normal.  Mouth/Throat: Oropharynx is clear and moist.  Nares are injected and congested  No sinus tenderness  Clear post nasal drainage   Eyes: Conjunctivae and EOM are normal. Pupils are equal, round, and reactive to light. Right eye exhibits no discharge. Left eye exhibits no discharge.  Neck: Normal range of motion. Neck supple. No JVD present. No thyromegaly present.  Cardiovascular: Regular rhythm and normal heart sounds.   Tachycardic with fever  Pulmonary/Chest: Effort normal and breath sounds normal. No respiratory distress. He has no wheezes.  He has no rales. He exhibits no tenderness.  Harsh/ dry sounding cough with clear lungs   Abdominal: Soft. Bowel sounds are normal.  Musculoskeletal: He exhibits no edema.  Neurological: He is alert.  Skin: Skin is warm and dry. No rash noted.  Psychiatric: He has a normal mood and affect.          Assessment & Plan:

## 2013-03-06 NOTE — Assessment & Plan Note (Signed)
With high fever and dry cough/ malaise  Of note-pt is on imuran  Reassuring exam Disc symptomatic care - see instructions on AVS (will alt tylenol and motrin for fever) Hycodan for cough as needed with warning of sedation tamiflu as directed  Update if not starting to improve in a week or if worsening

## 2013-05-02 ENCOUNTER — Other Ambulatory Visit: Payer: Self-pay | Admitting: Gastroenterology

## 2013-05-02 NOTE — Telephone Encounter (Signed)
Patient needs to contact the office to schedule a yearly follow up before any further refills

## 2013-05-28 ENCOUNTER — Other Ambulatory Visit: Payer: Self-pay | Admitting: Gastroenterology

## 2015-08-19 ENCOUNTER — Encounter: Payer: Self-pay | Admitting: Gastroenterology

## 2017-12-04 DIAGNOSIS — Z23 Encounter for immunization: Secondary | ICD-10-CM | POA: Diagnosis not present

## 2017-12-06 ENCOUNTER — Other Ambulatory Visit: Payer: Self-pay

## 2017-12-06 DIAGNOSIS — K5150 Left sided colitis without complications: Secondary | ICD-10-CM

## 2017-12-07 ENCOUNTER — Ambulatory Visit: Payer: Self-pay

## 2017-12-12 ENCOUNTER — Telehealth: Payer: Self-pay | Admitting: Family Medicine

## 2017-12-12 NOTE — Telephone Encounter (Signed)
Copied from [place] [id number]. Topic: General - Other >> Dec 12, 2017  3:06 PM Lennox Solders wrote: Reason for CRM: pt is calling and would like rx fax to [place] day [phone].phone number [phone] Pt would like to get tdap booster. Pt booster ran out in march 2019.

## 2017-12-12 NOTE — Telephone Encounter (Signed)
Please ask him who will administer the injection (? Nurse/? Pharmacy) and let me know so I can write the px  Thanks

## 2017-12-13 MED ORDER — TETANUS-DIPHTH-ACELL PERTUSSIS 5-2.5-18.5 LF-MCG/0.5 IM SUSP: 0.5000 mL | Freq: Once | INTRAMUSCULAR | 0 refills | Status: AC

## 2017-12-13 NOTE — Telephone Encounter (Signed)
I wrote order  Please verify that is the adult dose before calling in  Have them let us know when he gets it so it can be recorded in imm hx  Thanks

## 2017-12-13 NOTE — Telephone Encounter (Signed)
Boostrix is the adult tdap vaccine (it's correct) pt request that you print out Rx and sign it so I can fax it to [facility]

## 2017-12-13 NOTE — Telephone Encounter (Signed)
Left VM requesting pt to calling the office back, CRM created

## 2017-12-13 NOTE — Telephone Encounter (Signed)
Printed and in IN box

## 2017-12-13 NOTE — Telephone Encounter (Signed)
[facility] at [facility]. RN will administer

## 2017-12-14 NOTE — Telephone Encounter (Signed)
Order faxed.

## 2017-12-19 ENCOUNTER — Ambulatory Visit: Payer: Self-pay

## 2017-12-19 VITALS — Temp 96.8°F

## 2017-12-19 DIAGNOSIS — Z0000 Encounter for general adult medical examination without abnormal findings: Secondary | ICD-10-CM

## 2018-04-27 ENCOUNTER — Encounter: Payer: Self-pay | Admitting: Adult Health

## 2018-04-27 ENCOUNTER — Ambulatory Visit: Payer: Self-pay | Admitting: Registered Nurse

## 2018-04-27 ENCOUNTER — Ambulatory Visit: Payer: Self-pay | Admitting: Adult Health

## 2018-04-27 VITALS — BP 126/74 | HR 88 | Temp 99.5°F | Resp 16 | Ht 73.0 in | Wt 146.0 lb

## 2018-04-27 DIAGNOSIS — R509 Fever, unspecified: Secondary | ICD-10-CM

## 2018-04-27 DIAGNOSIS — J0101 Acute recurrent maxillary sinusitis: Secondary | ICD-10-CM

## 2018-04-27 DIAGNOSIS — B349 Viral infection, unspecified: Secondary | ICD-10-CM

## 2018-04-27 LAB — POCT INFLUENZA A/B
INFLUENZA B, POC: NEGATIVE
Influenza A, POC: NEGATIVE

## 2018-04-27 MED ORDER — FLUTICASONE PROPIONATE 50 MCG/ACT NA SUSP: 2.0000 | Freq: Every day | NASAL | 6 refills | Status: DC

## 2018-04-27 MED ORDER — OSELTAMIVIR PHOSPHATE 75 MG PO CAPS: 75.0000 mg | ORAL_CAPSULE | Freq: Two times a day (BID) | ORAL | 0 refills | Status: AC

## 2018-04-27 MED ORDER — DOXYCYCLINE HYCLATE 100 MG PO TABS: 100.0000 mg | ORAL_TABLET | Freq: Two times a day (BID) | ORAL | 0 refills | Status: DC

## 2018-04-27 MED ORDER — SALINE SPRAY 0.65 % NA SOLN: 2.0000 | NASAL | 0 refills | Status: DC

## 2018-04-27 MED ORDER — ACETAMINOPHEN 500 MG PO TABS: 1000.0000 mg | ORAL_TABLET | Freq: Four times a day (QID) | ORAL | 0 refills | Status: AC | PRN

## 2018-04-27 NOTE — Progress Notes (Signed)
Subjective:    Patient ID: Jeffrey Jenkins, male    DOB: December 02, 1985, 33 y.o.   MRN: [id number]  32y/o married caucasian male established patient here for evaluation feeling fatigued/fever/chills/nausea/decreased appetite.  Wife with flu over christmas.  + sick contacts at work viral URI sx.  Took tylenol 2 tabs at 1000 this am.  Nonproductive cough, pressure behind eyes also.     Review of Systems  Constitutional: Positive for chills, fatigue and fever. Negative for activity change, appetite change, diaphoresis and unexpected weight change.  HENT: Positive for rhinorrhea and sinus pressure. Negative for congestion, dental problem, drooling, ear discharge, ear pain, facial swelling, hearing loss, mouth sores, nosebleeds, postnasal drip, sinus pain, sneezing, sore throat, tinnitus, trouble swallowing and voice change.   Eyes: Negative for photophobia, pain, discharge, redness, itching and visual disturbance.  Respiratory: Positive for cough. Negative for choking, chest tightness, shortness of breath, wheezing and stridor.   Cardiovascular: Negative for chest pain, palpitations and leg swelling.  Gastrointestinal: Positive for nausea. Negative for abdominal distention, abdominal pain, blood in stool, constipation, diarrhea and vomiting.  Endocrine: Negative for cold intolerance and heat intolerance.  Genitourinary: Negative for dysuria.  Musculoskeletal: Negative for arthralgias, back pain, gait problem, joint swelling, myalgias, neck pain and neck stiffness.  Skin: Negative for color change, pallor, rash and wound.  Allergic/Immunologic: Positive for environmental allergies. Negative for food allergies and immunocompromised state.  Neurological: Negative for dizziness, tremors, seizures, syncope, facial asymmetry, speech difficulty, weakness, light-headedness, numbness and headaches.  Hematological: Negative for adenopathy. Does not bruise/bleed easily.  Psychiatric/Behavioral: Negative for  agitation, behavioral problems, confusion and sleep disturbance.       Objective:   Physical Exam Vitals signs and nursing note reviewed.  Constitutional:      General: He is awake. He is not in acute distress.    Appearance: Normal appearance. He is well-developed, well-groomed and normal weight. He is ill-appearing. He is not toxic-appearing or diaphoretic.  HENT:     Head: Normocephalic and atraumatic.     Jaw: There is normal jaw occlusion. No trismus.     Salivary Glands: Right salivary gland is not diffusely enlarged or tender. Left salivary gland is not diffusely enlarged or tender.     Right Ear: Hearing, ear canal and external ear normal. A middle ear effusion is present. There is no impacted cerumen.     Left Ear: Hearing, ear canal and external ear normal. A middle ear effusion is present. There is no impacted cerumen.     Nose: Mucosal edema, congestion and rhinorrhea present. No nasal deformity, septal deviation or laceration.     Right Turbinates: Enlarged and swollen. Not pale.     Left Turbinates: Enlarged and swollen. Not pale.     Right Sinus: Maxillary sinus tenderness present. No frontal sinus tenderness.     Left Sinus: Maxillary sinus tenderness present. No frontal sinus tenderness.     Mouth/Throat:     Lips: Pink. No lesions.     Mouth: Mucous membranes are moist. Mucous membranes are not pale, not dry and not cyanotic. No lacerations or oral lesions.     Dentition: Normal dentition. Does not have dentures. No dental caries or dental abscesses.     Tongue: No lesions.     Pharynx: Uvula midline. Pharyngeal swelling and posterior oropharyngeal erythema present. No oropharyngeal exudate or uvula swelling.     Tonsils: No tonsillar exudate or tonsillar abscesses. Swelling: 0 on the right. 0 on the left.  Comments: Bilateral allergic shiners; cobblestoning posterior pharynx; bilateral TMs air fluid level  Opacity 6 oclock; bilateral nasal turbinates edema erythema  clear discharge Eyes:     General: Lids are normal. Allergic shiner present. No visual field deficit or scleral icterus.       Right eye: No foreign body, discharge or hordeolum.        Left eye: No foreign body, discharge or hordeolum.     Extraocular Movements: Extraocular movements intact.     Right eye: Normal extraocular motion and no nystagmus.     Left eye: Normal extraocular motion and no nystagmus.     Conjunctiva/sclera:     Right eye: Right conjunctiva is injected. No chemosis, exudate or hemorrhage.    Left eye: Left conjunctiva is injected. No chemosis, exudate or hemorrhage.    Pupils: Pupils are equal, round, and reactive to light. Pupils are equal.     Right eye: Pupil is round and reactive.     Left eye: Pupil is round and reactive.     Comments: 1-2+/4 injection bilateral bulbar and eyelid conjunctiva  Neck:     Musculoskeletal: Normal range of motion and neck supple. Normal range of motion. No edema, erythema, neck rigidity, spinous process tenderness or muscular tenderness.     Thyroid: No thyroid mass or thyromegaly.     Trachea: Trachea normal. No tracheal tenderness or tracheal deviation.  Cardiovascular:     Rate and Rhythm: Normal rate and regular rhythm.     Chest Wall: PMI is not displaced.     Heart sounds: Normal heart sounds, S1 normal and S2 normal. No murmur. No friction rub. No gallop.   Pulmonary:     Effort: Pulmonary effort is normal. No respiratory distress.     Breath sounds: Normal breath sounds and air entry. No stridor, decreased air movement or transmitted upper airway sounds. No decreased breath sounds, wheezing, rhonchi or rales.     Comments: No cough observed in exam room; spoke full sentences without difficulty Abdominal:     General: Abdomen is flat. Bowel sounds are decreased. There is no distension.     Palpations: Abdomen is soft. There is no shifting dullness, fluid wave, hepatomegaly, splenomegaly, mass or pulsatile mass.      Tenderness: There is no abdominal tenderness. There is no right CVA tenderness, left CVA tenderness, guarding or rebound. Negative signs include Murphy's sign.     Hernia: No hernia is present. There is no hernia in the umbilical area or ventral area.     Comments: Dull to percussion x 4 quads; hypoactive bowel sounds x 4 quads  Musculoskeletal: Normal range of motion.        General: No tenderness.     Right shoulder: Normal.     Left shoulder: Normal.     Right elbow: Normal.    Left elbow: Normal.     Right hip: Normal.     Left hip: Normal.     Right knee: Normal.     Left knee: Normal.     Right ankle: Normal.     Left ankle: Normal.     Cervical back: Normal.     Thoracic back: Normal.     Lumbar back: Normal.     Right hand: Normal.     Left hand: Normal.     Right lower leg: No edema.     Left lower leg: No edema.  Lymphadenopathy:     Head:     Right side  of head: No submental, submandibular, tonsillar, preauricular, posterior auricular or occipital adenopathy.     Left side of head: No submental, submandibular, tonsillar, preauricular, posterior auricular or occipital adenopathy.     Cervical: No cervical adenopathy.     Right cervical: No superficial, deep or posterior cervical adenopathy.    Left cervical: No superficial, deep or posterior cervical adenopathy.  Skin:    General: Skin is warm and dry.     Capillary Refill: Capillary refill takes less than 2 seconds.     Coloration: Skin is not ashen, cyanotic, jaundiced, mottled, pale or sallow.     Findings: No abrasion, abscess, acne, bruising, burn, ecchymosis, erythema, signs of injury, laceration, lesion, petechiae or rash.     Nails: There is no clubbing.   Neurological:     General: No focal deficit present.     Mental Status: He is alert and oriented to person, place, and time. Mental status is at baseline.     GCS: GCS eye subscore is 4. GCS verbal subscore is 5. GCS motor subscore is 6.     Cranial Nerves:  Cranial nerves are intact. No cranial nerve deficit, dysarthria or facial asymmetry.     Sensory: Sensation is intact. No sensory deficit.     Motor: Motor function is intact. No weakness, tremor, atrophy, abnormal muscle tone or seizure activity.     Coordination: Coordination is intact. Coordination normal.     Gait: Gait is intact. Gait normal.     Comments: Gait sure and steady in hallway; on/off exam table without difficulty; laid down on table when waiting for POCT flu to be complete/position of comfort and wearing coat as feeling cold/chills  Psychiatric:        Attention and Perception: Attention and perception normal.        Mood and Affect: Mood and affect normal.        Speech: Speech normal.        Behavior: Behavior normal. Behavior is cooperative.        Thought Content: Thought content normal.        Cognition and Memory: Cognition and memory normal.        Judgment: Judgment normal.    Discussed with patient POCT flu testing negative.  Early in his illness course symptoms just started at 1000 today.  Patient would like to start tamiflu as spouse with flu in December and other [place] staff with flu + exposures as he has child and spouse at home.  Will self-quarantine at home also today-Sunday next 72 hours.  Electronic Rx sent to his pharmacy of choice [facility] tamiflu 63m po BID x 5 days #10 RF0.  Patient verbalized understanding information/instructions, agreed with plan of care and had no further questions at this time.       Assessment & Plan:  A-acute recurrent maxillary sinusitis, viral illness  P-Restart flonase 1 spray each nostril BID #1 RF6 to his pharmacy of choice, saline 2 sprays each nostril q2h wa prn congestion.  If no improvement with 48 hours of saline and flonase use start doxycycline 1029mpo BID x 10 days #20 RF0 electronic rx to his pharmacy of choice.  Restart zyrtec 1083mo daily.  Spring bloom early this year.  Has neti pot at home restart use.   Refused work excuse discussed if still running fever Monday notify clinic staff.  Denied personal or family history of ENT cancer.  Shower BID especially prior to bed. No evidence of  systemic bacterial infection, non toxic and well hydrated.  I do not see where any further testing or imaging is necessary at this time.   I will suggest supportive care, rest, good hygiene and encourage the patient to take adequate fluids.  The patient is to return to clinic or EMERGENCY ROOM if symptoms worsen or change significantly.  Exitcare handout on influenza, sinusitis and sinus rinse printed and given to patient.  Patient verbalized agreement and understanding of treatment plan and had no further questions at this time.    Patient may use normal saline nasal spray 2 sprays each nostril q2h wa as needed. flonase 65mg 1 spray each nostril BID #1 RF0.  Patient denied personal or family history of ENT cancer.  OTC antihistamine of choice claritin/zyrtec 146mpo daily.  Avoid triggers if possible.  Shower prior to bedtime if exposed to triggers.  If allergic dust/dust mites recommend mattress/pillow covers/encasements; washing linens, vacuuming, sweeping, dusting weekly.  Call or return to clinic as needed if these symptoms worsen or fail to improve as anticipated.   Exitcare handout on nonallergic/allergic rhinitis and sinus rinse given to patient.  Patient verbalized understanding of instructions, agreed with plan of care and had no further questions at this time.  P2:  Avoidance and hand washing.   I have recommended clear fluids and bland diet.  Avoid dairy/spicy, fried and large portions of meat while having nausea.  If vomiting hold po intake x 1 hour.  Then sips clear fluids like broths, ginger ale, power ade, gatorade, pedialyte may advance to soft/bland if no vomiting x 24 hours and appetite returned otherwise hydration main focus. Refused  zofran Rx Return to the clinic if symptoms persist or worsen; I have alerted  the patient to call if high fever, dehydration, marked weakness, fainting, increased abdominal pain, blood in stool or vomit (red or black).   Exitcare handout on viral gastroenteritis printed and given to patient.  Discussed with patient mildly dehydrated today.  Coffee/Tea are diuretic recommended gatorade/nondairy popsicles/ginger ale/broths first line if possible or alternating tea/water.   Patient verbalized agreement and understanding of treatment plan and had no further questions at this time.  P2:  Hand washing and cover cough

## 2018-04-27 NOTE — Patient Instructions (Signed)
Viral Gastroenteritis, Adult  Viral gastroenteritis is also known as the stomach flu. This condition is caused by various viruses. These viruses can be passed from person to person very easily (are very contagious). This condition may affect your stomach, small intestine, and large intestine. It can cause sudden watery diarrhea, fever, and vomiting. Diarrhea and vomiting can make you feel weak and cause you to become dehydrated. You may not be able to keep fluids down. Dehydration can make you tired and thirsty, cause you to have a dry mouth, and decrease how often you urinate. Older adults and people with other diseases or a weak immune system are at higher risk for dehydration. It is important to replace the fluids that you lose from diarrhea and vomiting. If you become severely dehydrated, you may need to get fluids through an IV tube. What are the causes? Gastroenteritis is caused by various viruses, including rotavirus and norovirus. Norovirus is the most common cause in adults. You can get sick by eating food, drinking water, or touching a surface contaminated with one of these viruses. You can also get sick from sharing utensils or other personal items with an infected person. What increases the risk? This condition is more likely to develop in people:  Who have a weak defense system (immune system).  Who live with one or more children who are younger than 67 years old.  Who live in a nursing home.  Who go on cruise ships. What are the signs or symptoms? Symptoms of this condition start suddenly 1-2 days after exposure to a virus. Symptoms may last a few days or as long as a week. The most common symptoms are watery diarrhea and vomiting. Other symptoms include:  Fever.  Headache.  Fatigue.  Pain in the abdomen.  Chills.  Weakness.  Nausea.  Muscle aches.  Loss of appetite. How is this diagnosed? This condition is diagnosed with a medical history and physical exam. You  may also have a stool test to check for viruses or other infections. How is this treated? This condition typically goes away on its own. The focus of treatment is to restore lost fluids (rehydration). Your health care provider may recommend that you take an oral rehydration solution (ORS) to replace important salts and minerals (electrolytes) in your body. Severe cases of this condition may require giving fluids through an IV tube. Treatment may also include medicine to help with your symptoms. Follow these instructions at home:  Follow instructions from your health care provider about how to care for yourself at home. Follow these recommendations as told by your health care provider:  Take an ORS. This is a drink that is sold at pharmacies and retail stores.  Drink clear fluids in small amounts as you are able. Clear fluids include water, ice chips, diluted fruit juice, and low-calorie sports drinks.  Eat bland, easy-to-digest foods in small amounts as you are able. These foods include bananas, applesauce, rice, lean meats, toast, and crackers.  Avoid fluids that contain a lot of sugar or caffeine, such as energy drinks, sports drinks, and soda.  Avoid alcohol.  Avoid spicy or fatty foods. General instructions  Drink enough fluid to keep your urine clear or pale yellow.  Wash your hands often. If soap and water are not available, use hand sanitizer.  Make sure that all people in your household wash their hands well and often.  Take over-the-counter and prescription medicines only as told by your health care provider.  Rest at  home while you recover.  Watch your condition for any changes.  Take a warm bath to relieve any burning or pain from frequent diarrhea episodes.  Keep all follow-up visits as told by your health care provider. This is important. Contact a health care provider if:  You cannot keep fluids down.  Your symptoms get worse.  You have new symptoms.  You  feel light-headed or dizzy.  You have muscle cramps. Get help right away if:  You have chest pain.  You feel extremely weak or you faint.  You see blood in your vomit.  Your vomit looks like coffee grounds.  You have bloody or black stools or stools that look like tar.  You have a severe headache, a stiff neck, or both.  You have a rash.  You have severe pain, cramping, or bloating in your abdomen.  You have trouble breathing or you are breathing very quickly.  Your heart is beating very quickly.  Your skin feels cold and clammy.  You feel confused.  You have pain when you urinate.  You have signs of dehydration, such as: ? Dark urine, very little urine, or no urine. ? Cracked lips. ? Dry mouth. ? Sunken eyes. ? Sleepiness. ? Weakness. This information is not intended to replace advice given to you by your health care provider. Make sure you discuss any questions you have with your health care provider. Document Released: 03/14/2005 Document Revised: 10/27/2016 Document Reviewed: 11/18/2014 Elsevier Interactive Patient Education  2019 [employer] Sinusitis, Adult Sinusitis is inflammation of your sinuses. Sinuses are hollow spaces in the bones around your face. Your sinuses are located:  Around your eyes.  In the middle of your forehead.  Behind your nose.  In your cheekbones. Mucus normally drains out of your sinuses. When your nasal tissues become inflamed or swollen, mucus can become trapped or blocked. This allows bacteria, viruses, and fungi to grow, which leads to infection. Most infections of the sinuses are caused by a virus. Sinusitis can develop quickly. It can last for up to 4 weeks (acute) or for more than 12 weeks (chronic). Sinusitis often develops after a cold. What are the causes? This condition is caused by anything that creates swelling in the sinuses or stops mucus from draining. This includes:  Allergies.  Asthma.  Infection from  bacteria or viruses.  Deformities or blockages in your nose or sinuses.  Abnormal growths in the nose (nasal polyps).  Pollutants, such as chemicals or irritants in the air.  Infection from fungi (rare). What increases the risk? You are more likely to develop this condition if you:  Have a weak body defense system (immune system).  Do a lot of swimming or diving.  Overuse nasal sprays.  Smoke. What are the signs or symptoms? The main symptoms of this condition are pain and a feeling of pressure around the affected sinuses. Other symptoms include:  Stuffy nose or congestion.  Thick drainage from your nose.  Swelling and warmth over the affected sinuses.  Headache.  Upper toothache.  A cough that may get worse at night.  Extra mucus that collects in the throat or the back of the nose (postnasal drip).  Decreased sense of smell and taste.  Fatigue.  A fever.  Sore throat.  Bad breath. How is this diagnosed? This condition is diagnosed based on:  Your symptoms.  Your medical history.  A physical exam.  Tests to find out if your condition is acute or chronic. This may  include: ? Checking your nose for nasal polyps. ? Viewing your sinuses using a device that has a light (endoscope). ? Testing for allergies or bacteria. ? Imaging tests, such as an MRI or CT scan. In rare cases, a bone biopsy may be done to rule out more serious types of fungal sinus disease. How is this treated? Treatment for sinusitis depends on the cause and whether your condition is chronic or acute.  If caused by a virus, your symptoms should go away on their own within 10 days. You may be given medicines to relieve symptoms. They include: ? Medicines that shrink swollen nasal passages (topical intranasal decongestants). ? Medicines that treat allergies (antihistamines). ? A spray that eases inflammation of the nostrils (topical intranasal corticosteroids). ? Rinses that help get rid of  thick mucus in your nose (nasal saline washes).  If caused by bacteria, your health care provider may recommend waiting to see if your symptoms improve. Most bacterial infections will get better without antibiotic medicine. You may be given antibiotics if you have: ? A severe infection. ? A weak immune system.  If caused by narrow nasal passages or nasal polyps, you may need to have surgery. Follow these instructions at home: Medicines  Take, use, or apply over-the-counter and prescription medicines only as told by your health care provider. These may include nasal sprays.  If you were prescribed an antibiotic medicine, take it as told by your health care provider. Do not stop taking the antibiotic even if you start to feel better. Hydrate and humidify   Drink enough fluid to keep your urine pale yellow. Staying hydrated will help to thin your mucus.  Use a cool mist humidifier to keep the humidity level in your home above 50%.  Inhale steam for 10-15 minutes, 3-4 times a day, or as told by your health care provider. You can do this in the bathroom while a hot shower is running.  Limit your exposure to cool or dry air. Rest  Rest as much as possible.  Sleep with your head raised (elevated).  Make sure you get enough sleep each night. General instructions   Apply a warm, moist washcloth to your face 3-4 times a day or as told by your health care provider. This will help with discomfort.  Wash your hands often with soap and water to reduce your exposure to germs. If soap and water are not available, use hand sanitizer.  Do not smoke. Avoid being around people who are smoking (secondhand smoke).  Keep all follow-up visits as told by your health care provider. This is important. Contact a health care provider if:  You have a fever.  Your symptoms get worse.  Your symptoms do not improve within 10 days. Get help right away if:  You have a severe headache.  You have  persistent vomiting.  You have severe pain or swelling around your face or eyes.  You have vision problems.  You develop confusion.  Your neck is stiff.  You have trouble breathing. Summary  Sinusitis is soreness and inflammation of your sinuses. Sinuses are hollow spaces in the bones around your face.  This condition is caused by nasal tissues that become inflamed or swollen. The swelling traps or blocks the flow of mucus. This allows bacteria, viruses, and fungi to grow, which leads to infection.  If you were prescribed an antibiotic medicine, take it as told by your health care provider. Do not stop taking the antibiotic even if you start  to feel better.  Keep all follow-up visits as told by your health care provider. This is important. This information is not intended to replace advice given to you by your health care provider. Make sure you discuss any questions you have with your health care provider. Document Released: 03/14/2005 Document Revised: 08/14/2017 Document Reviewed: 08/14/2017 Elsevier Interactive Patient Education  2019 [employer]. How to Perform a Sinus Rinse A sinus rinse is a home treatment that is used to rinse your sinuses with a sterile mixture of salt and water (saline solution). Sinuses are air-filled spaces in your skull behind the bones of your face and forehead that open into your nasal cavity. A sinus rinse can help to clear mucus, dirt, dust, or pollen from your nasal cavity. You may do a sinus rinse when you have a cold, a virus, nasal allergy symptoms, a sinus infection, or stuffiness in your nose or sinuses. Talk with your health care provider about whether a sinus rinse might help you. What are the risks? A sinus rinse is generally safe and effective. However, there are a few risks, which include:  A burning sensation in your sinuses. This may happen if you do not make the saline solution as directed. Be sure to follow all directions when making  the saline solution.  Nasal irritation.  Infection from contaminated water. This is rare, but possible. Do not do a sinus rinse if you have had ear or nasal surgery, ear infection, or blocked ears. Supplies needed:  Saline solution or powder.  Distilled or sterile water may be needed to mix with saline powder. ? You may use boiled and cooled tap water. Boil tap water for 5 minutes; cool until it is lukewarm. Use within 24 hours. ? Do not use regular tap water to mix with the saline solution.  Neti pot or nasal rinse bottle. These supplies release the saline solution into your nose and through your sinuses. Neti pots and nasal rinse bottles can be purchased at [employer], a health food store, or online. How to perform a sinus rinse  1. Wash your hands with soap and water. 2. Wash your device according to the directions that came with the product and then dry it. 3. Use the solution that comes with your product or one that is sold separately in stores. Follow the mixing directions on the package if you need to mix with sterile or distilled water. 4. Fill the device with the amount of saline solution noted in the device instructions. 5. Stand over a sink and tilt your head sideways over the sink. 6. Place the spout of the device in your upper nostril (the one closer to the ceiling). 7. Gently pour or squeeze the saline solution into your nasal cavity. The liquid should drain out from the lower nostril if you are not too congested. 8. While rinsing, breathe through your open mouth. 9. Gently blow your nose to clear any mucus and rinse solution. Blowing too hard may cause ear pain. 10. Repeat in your other nostril. 11. Clean and rinse your device with clean water and then air-dry it. Talk with your health care provider or pharmacist if you have questions about how to do a sinus rinse. Summary  A sinus rinse is a home treatment that is used to rinse your sinuses with a sterile  mixture of salt and water (saline solution).  A sinus rinse is generally safe and effective. Follow all instructions carefully.  Before doing a sinus rinse,  talk with your health care provider about whether it would be helpful for you. This information is not intended to replace advice given to you by your health care provider. Make sure you discuss any questions you have with your health care provider. Document Released: 10/09/2013 Document Revised: 01/09/2017 Document Reviewed: 01/09/2017 Elsevier Interactive Patient Education  2019 [employer] Nonallergic Rhinitis Nonallergic rhinitis is a condition that causes symptoms that affect the nose, such as a runny nose and a stuffed-up nose (nasal congestion) that can make it hard to breathe through the nose. This condition is different from having an allergy (allergic rhinitis). Allergic rhinitis occurs when the body's defense system (immune system) reacts to a substance that you are allergic to (allergen), such as pollen, pet dander, mold, or dust. Nonallergic rhinitis has many similar symptoms, but it is not caused by allergens. Nonallergic rhinitis can be a short-term or long-term problem. What are the causes? This condition can be caused by many different things. Some common types of nonallergic rhinitis include: Infectious rhinitis  This is usually due to an infection in the upper respiratory tract. Vasomotor rhinitis  This is the most common type of long-term nonallergic rhinitis.  It is caused by too much blood flow through the nose, which makes the tissue inside of the nose swell.  Symptoms are often triggered by strong odors, cold air, stress, drinking alcohol, cigarette smoke, or changes in the weather. Occupational rhinitis  This type is caused by triggers in the workplace, such as chemicals, dusts, animal dander, or air pollution. Hormonal rhinitis  This type occurs in women as a result of an increase in the male hormone  estrogen.  It may occur during pregnancy, puberty, and menstrual cycles.  Symptoms improve when estrogen levels drop. Drug-induced rhinitis Several drugs can cause nonallergic rhinitis, including:  Medicines that are used to treat high blood pressure, heart disease, and Parkinson disease.  Aspirin and NSAIDs.  Over-the-counter nasal decongestant sprays. These can cause a type of nonallergic rhinitis (rhinitis medicamentosa) when they are used for more than a few days. Nonallergic rhinitis with eosinophilia syndrome (NARES)  This type is caused by having too much of a certain type of white blood cell (eosinophil). Nonallergic rhinitis can also be caused by a reaction to eating hot or spicy foods. This does not usually cause long-term symptoms. In some cases, the cause of nonallergic rhinitis is not known. What increases the risk? You are more likely to develop this condition if:  You are 75-47 years of age.  You are a woman. Women are twice as likely to have this condition. What are the signs or symptoms? Common symptoms of this condition include:  Nasal congestion.  Runny nose.  The feeling of mucus going down the back of the throat (postnasal drip).  Trouble sleeping at night and daytime sleepiness. Less common symptoms include:  Sneezing.  Coughing.  Itchy nose.  Bloodshot eyes. How is this diagnosed? This condition may be diagnosed based on:  Your symptoms and medical history.  A physical exam.  Allergy testing to rule out allergic rhinitis. You may have skin tests or blood tests. In some cases, the health care provider may take a swab of nasal secretions to look for an increased number of eosinophils. This would be done to confirm a diagnosis of NARES. How is this treated? Treatment for this condition depends on the cause. No single treatment works for everyone. Work with your health care provider to find the best treatment for you.  Treatment may  include:  Avoiding the things that trigger your symptoms.  Using medicines to relieve congestion, such as: ? Steroid nasal spray. There are many types. You may need to try a few to find out which one works best. ? Decongestant medicine. This may be an oral medicine or a nasal spray. These medicines are only used for a short time.  Using medicines to relieve a runny nose. These may include antihistamine medicines or anticholinergic nasal sprays.  Surgery to remove tissue from inside the nose may be needed in severe cases if the condition has not improved after 6-12 months of medical treatment. Follow these instructions at home:  Take or use over-the-counter and prescription medicines only as told by your health care provider. Do not stop using your medicine even if you start to feel better.  Use salt-water (saline) rinses or other solutions (nasal washes or irrigations) to wash or rinse out the inside of your nose as told by your health care provider.  Do not take NSAIDs or medicines that contain aspirin if they make your symptoms worse.  Do not drink alcohol if it makes your symptoms worse.  Do not use any tobacco products, such as cigarettes, chewing tobacco, and e-cigarettes. If you need help quitting, ask your health care provider.  Avoid secondhand smoke.  Get some exercise every day. Exercise may help reduce symptoms of nonallergic rhinitis for some people. Ask your health care provider how much exercise and what types of exercise are safe for you.  Sleep with the head of your bed raised (elevated). This may reduce nighttime nasal congestion.  Keep all follow-up visits as told by your health care provider. This is important. Contact a health care provider if:  You have a fever.  Your symptoms are getting worse at home.  Your symptoms are not responding to medicine.  You develop new symptoms, especially a headache or nosebleed. This information is not intended to replace  advice given to you by your health care provider. Make sure you discuss any questions you have with your health care provider. Document Released: 07/06/2015 Document Revised: 08/20/2015 Document Reviewed: 06/04/2015 Elsevier Interactive Patient Education  2019 [employer] Allergic Rhinitis, Adult Allergic rhinitis is an allergic reaction that affects the mucous membrane inside the nose. It causes sneezing, a runny or stuffy nose, and the feeling of mucus going down the back of the throat (postnasal drip). Allergic rhinitis can be mild to severe. There are two types of allergic rhinitis:  Seasonal. This type is also called hay fever. It happens only during certain seasons.  Perennial. This type can happen at any time of the year. What are the causes? This condition happens when the body's defense system (immune system) responds to certain harmless substances called allergens as though they were germs.  Seasonal allergic rhinitis is triggered by pollen, which can come from grasses, trees, and weeds. Perennial allergic rhinitis may be caused by:  House dust mites.  Pet dander.  Mold spores. What are the signs or symptoms? Symptoms of this condition include:  Sneezing.  Runny or stuffy nose (nasal congestion).  Postnasal drip.  Itchy nose.  Tearing of the eyes.  Trouble sleeping.  Daytime sleepiness. How is this diagnosed? This condition may be diagnosed based on:  Your medical history.  A physical exam.  Tests to check for related conditions, such as: ? Asthma. ? Pink eye. ? Ear infection. ? Upper respiratory infection.  Tests to find out which allergens trigger your  symptoms. These may include skin or blood tests. How is this treated? There is no cure for this condition, but treatment can help control symptoms. Treatment may include:  Taking medicines that block allergy symptoms, such as antihistamines. Medicine may be given as a shot, nasal spray, or  pill.  Avoiding the allergen.  Desensitization. This treatment involves getting ongoing shots until your body becomes less sensitive to the allergen. This treatment may be done if other treatments do not help.  If taking medicine and avoiding the allergen does not work, new, stronger medicines may be prescribed. Follow these instructions at home:  Find out what you are allergic to. Common allergens include smoke, dust, and pollen.  Avoid the things you are allergic to. These are some things you can do to help avoid allergens: ? Replace carpet with wood, tile, or vinyl flooring. Carpet can trap dander and dust. ? Do not smoke. Do not allow smoking in your home. ? Change your heating and air conditioning filter at least once a month. ? During allergy season:  Keep windows closed as much as possible.  Plan outdoor activities when pollen counts are lowest. This is usually during the evening hours.  When coming indoors, change clothing and shower before sitting on furniture or bedding.  Take over-the-counter and prescription medicines only as told by your health care provider.  Keep all follow-up visits as told by your health care provider. This is important. Contact a health care provider if:  You have a fever.  You develop a persistent cough.  You make whistling sounds when you breathe (you wheeze).  Your symptoms interfere with your normal daily activities. Get help right away if:  You have shortness of breath. Summary  This condition can be managed by taking medicines as directed and avoiding allergens.  Contact your health care provider if you develop a persistent cough or fever.  During allergy season, keep windows closed as much as possible. This information is not intended to replace advice given to you by your health care provider. Make sure you discuss any questions you have with your health care provider. Document Released: 12/07/2000 Document Revised: 04/21/2016  Document Reviewed: 04/21/2016 Elsevier Interactive Patient Education  2019 [employer]. Influenza, Adult Influenza, more commonly known as "the flu," is a viral infection that mainly affects the respiratory tract. The respiratory tract includes organs that help you breathe, such as the lungs, nose, and throat. The flu causes many symptoms similar to the common cold along with high fever and body aches. The flu spreads easily from person to person (is contagious). Getting a flu shot (influenza vaccination) every year is the best way to prevent the flu. What are the causes? This condition is caused by the influenza virus. You can get the virus by:  Breathing in droplets that are in the air from an infected person's cough or sneeze.  Touching something that has been exposed to the virus (has been contaminated) and then touching your mouth, nose, or eyes. What increases the risk? The following factors may make you more likely to get the flu:  Not washing or sanitizing your hands often.  Having close contact with many people during cold and flu season.  Touching your mouth, eyes, or nose without first washing or sanitizing your hands.  Not getting a yearly (annual) flu shot. You may have a higher risk for the flu, including serious problems such as a lung infection (pneumonia), if you:  Are older than 65.  Are  pregnant.  Have a weakened disease-fighting system (immune system). You may have a weakened immune system if you: ? Have HIV or AIDS. ? Are undergoing chemotherapy. ? Are taking medicines that reduce (suppress) the activity of your immune system.  Have a long-term (chronic) illness, such as heart disease, kidney disease, diabetes, or lung disease.  Have a liver disorder.  Are severely overweight (morbidly obese).  Have anemia. This is a condition that affects your red blood cells.  Have asthma. What are the signs or symptoms? Symptoms of this condition usually begin  suddenly and last 4-14 days. They may include:  Fever and chills.  Headaches, body aches, or muscle aches.  Sore throat.  Cough.  Runny or stuffy (congested) nose.  Chest discomfort.  Poor appetite.  Weakness or fatigue.  Dizziness.  Nausea or vomiting. How is this diagnosed? This condition may be diagnosed based on:  Your symptoms and medical history.  A physical exam.  Swabbing your nose or throat and testing the fluid for the influenza virus. How is this treated? If the flu is diagnosed early, you can be treated with medicine that can help reduce how severe the illness is and how long it lasts (antiviral medicine). This may be given by mouth (orally) or through an IV. Taking care of yourself at home can help relieve symptoms. Your health care provider may recommend:  Taking over-the-counter medicines.  Drinking plenty of fluids. In many cases, the flu goes away on its own. If you have severe symptoms or complications, you may be treated in a hospital. Follow these instructions at home: Activity  Rest as needed and get plenty of sleep.  Stay home from work or school as told by your health care provider. Unless you are visiting your health care provider, avoid leaving home until your fever has been gone for 24 hours without taking medicine. Eating and drinking  Take an oral rehydration solution (ORS). This is a drink that is sold at pharmacies and retail stores.  Drink enough fluid to keep your urine pale yellow.  Drink clear fluids in small amounts as you are able. Clear fluids include water, ice chips, diluted fruit juice, and low-calorie sports drinks.  Eat bland, easy-to-digest foods in small amounts as you are able. These foods include bananas, applesauce, rice, lean meats, toast, and crackers.  Avoid drinking fluids that contain a lot of sugar or caffeine, such as energy drinks, regular sports drinks, and soda.  Avoid alcohol.  Avoid spicy or fatty  foods. General instructions      Take over-the-counter and prescription medicines only as told by your health care provider.  Use a cool mist humidifier to add humidity to the air in your home. This can make it easier to breathe.  Cover your mouth and nose when you cough or sneeze.  Wash your hands with soap and water often, especially after you cough or sneeze. If soap and water are not available, use alcohol-based hand sanitizer.  Keep all follow-up visits as told by your health care provider. This is important. How is this prevented?   Get an annual flu shot. You may get the flu shot in late summer, fall, or winter. Ask your health care provider when you should get your flu shot.  Avoid contact with people who are sick during cold and flu season. This is generally fall and winter. Contact a health care provider if:  You develop new symptoms.  You have: ? Chest pain. ? Diarrhea. ? A  fever.  Your cough gets worse.  You produce more mucus.  You feel nauseous or you vomit. Get help right away if:  You develop shortness of breath or difficulty breathing.  Your skin or nails turn a bluish color.  You have severe pain or stiffness in your neck.  You develop a sudden headache or sudden pain in your face or ear.  You cannot eat or drink without vomiting. Summary  Influenza, more commonly known as "the flu," is a viral infection that primarily affects your respiratory tract.  Symptoms of the flu usually begin suddenly and last 4-14 days.  Getting an annual flu shot is the best way to prevent getting the flu.  Stay home from work or school as told by your health care provider. Unless you are visiting your health care provider, avoid leaving home until your fever has been gone for 24 hours without taking medicine.  Keep all follow-up visits as told by your health care provider. This is important. This information is not intended to replace advice given to you by your  health care provider. Make sure you discuss any questions you have with your health care provider. Document Released: 03/11/2000 Document Revised: 08/30/2017 Document Reviewed: 08/30/2017 Elsevier Interactive Patient Education  2019 [employer].

## 2018-11-28 DIAGNOSIS — Z3009 Encounter for other general counseling and advice on contraception: Secondary | ICD-10-CM | POA: Diagnosis not present

## 2018-11-29 DIAGNOSIS — Z302 Encounter for sterilization: Secondary | ICD-10-CM | POA: Diagnosis not present

## 2018-12-25 DIAGNOSIS — J0110 Acute frontal sinusitis, unspecified: Secondary | ICD-10-CM | POA: Diagnosis not present

## 2019-01-16 ENCOUNTER — Telehealth: Payer: Self-pay | Admitting: Medical

## 2019-01-16 ENCOUNTER — Other Ambulatory Visit: Payer: Self-pay | Admitting: *Deleted

## 2019-01-16 ENCOUNTER — Other Ambulatory Visit: Payer: Self-pay

## 2019-01-16 DIAGNOSIS — Z8719 Personal history of other diseases of the digestive system: Secondary | ICD-10-CM

## 2019-01-16 DIAGNOSIS — J069 Acute upper respiratory infection, unspecified: Secondary | ICD-10-CM

## 2019-01-16 DIAGNOSIS — Z20822 Contact with and (suspected) exposure to covid-19: Secondary | ICD-10-CM

## 2019-01-16 DIAGNOSIS — J0100 Acute maxillary sinusitis, unspecified: Secondary | ICD-10-CM

## 2019-01-16 DIAGNOSIS — Z20828 Contact with and (suspected) exposure to other viral communicable diseases: Secondary | ICD-10-CM | POA: Diagnosis not present

## 2019-01-16 DIAGNOSIS — R059 Cough, unspecified: Secondary | ICD-10-CM

## 2019-01-16 DIAGNOSIS — R05 Cough: Secondary | ICD-10-CM

## 2019-01-16 MED ORDER — AMOXICILLIN-POT CLAVULANATE 875-125 MG PO TABS: 1.0000 | ORAL_TABLET | Freq: Two times a day (BID) | ORAL | 0 refills | Status: DC

## 2019-01-16 NOTE — Progress Notes (Signed)
Permission to treat by telemedicine.  Started not feeling well Monday night after mowing the lawn. Tuesday awoke still not feeling well ( He had a planned day off prior to illness so he rested and took [employer] ).  Has nasal congestion, nasal discharge white and light yellow, fever 100.0, runny nose clear to light yellow.Wednesday still not feeling well, so he called wellness.  He denies any known Covid-19 exposure. Not taking any allergy medications he usually takes them in the spring only.   Allergies  Allergen Reactions  . Lialda [Mesalamine]     REACTION: sick-- pancreatitis    Current Outpatient Medications:  .  amoxicillin-clavulanate (AUGMENTIN) 875-125 MG tablet, Take 1 tablet by mouth 2 (two) times daily. Take with food, Disp: 20 tablet, Rfl: 0 .  cetirizine-pseudoephedrine (ZYRTEC-D) 5-120 MG per tablet, Take 1 tablet by mouth as needed.  , Disp: , Rfl:  .  fluticasone (FLONASE) 50 MCG/ACT nasal spray, Place 2 sprays into both nostrils daily., Disp: 16 g, Rfl: 6 .  Multiple Vitamin (MULTIVITAMIN) tablet, Take 1 tablet by mouth daily.  , Disp: , Rfl:  .  sodium chloride (OCEAN) 0.65 % SOLN nasal spray, Place 2 sprays into both nostrils every 2 (two) hours while awake for 30 days., Disp: , Rfl: 0   Review of Systems  Constitutional: Positive for fever. Negative for malaise/fatigue.  HENT: Positive for congestion, sinus pain (cheeks and ratdiates to teeth) and sore throat. Negative for ear discharge, ear pain and nosebleeds.   Eyes: Negative.   Respiratory: Positive for cough (productive white to light yellow) and sputum production. Negative for shortness of breath and wheezing.   Cardiovascular: Negative for chest pain and leg swelling.  Gastrointestinal: Positive for diarrhea (loose one time on Monday night). Negative for abdominal pain, constipation, heartburn, nausea and vomiting.  Genitourinary: Negative.   Musculoskeletal: Negative.   Skin: Negative.   Neurological:  Positive for headaches (forehead an maxilllary area). Negative for speech change.  Endo/Heme/Allergies: Positive for environmental allergies.   PE: non preformed due to telemedicine appointment  Appointment. Cough noted during phone call and nasal congestion.   History of Ulcerative Colitis DX/PLAN  Sinusitis forehead/maxillary bacterial vs viral, COVID-19).  URI  Bacterial vs viral. Hx of immunocompromised- history of  UC Will cover with antibiotics, for bacterial infection. Explained to patient. Stop Alka-Seltzer. Meds ordered this encounter  Medications  . amoxicillin-clavulanate (AUGMENTIN) 875-125 MG tablet    Sig: Take 1 tablet by mouth 2 (two) times daily. Take with food    Dispense:  20 tablet    Refill:  0   Recomneded OTC Allegra-D and Restart OTC Flonase.Rest and fluids, Tylenol if fever or not feeling well.  Also recommended patient be tested for COVID-19 at [facility] due to vague symptoms.  He agrees he will go. Communicated to patient they will set up his MyChart and hsi PCP. I will also keep a look out for his test results. Patient to isolate from family , wife and his daughter.  Patient verbalizes understanding and has no questions at the end of our conversation. Reviewed red flags of when to go to the Emergency Department, SOB,CP, Wheezing, Diarrhea 5 or more /day, fever 102 or higher.Vomiting.

## 2019-01-18 ENCOUNTER — Telehealth: Payer: Self-pay | Admitting: Family Medicine

## 2019-01-18 NOTE — Telephone Encounter (Signed)
Patient stated that both him and his wife went to [facility] yesterday to be tested for covid. His wife has already received her results but the patient has not. He wanted to check with our office to see if we have received any results for him,

## 2019-01-18 NOTE — Telephone Encounter (Signed)
Still pending, not finalized will keep a watch for results

## 2019-01-19 LAB — NOVEL CORONAVIRUS, NAA: SARS-CoV-2, NAA: NOT DETECTED

## 2019-01-21 NOTE — Telephone Encounter (Signed)
Pt viewed results on mychart.

## 2019-01-23 ENCOUNTER — Ambulatory Visit: Payer: Self-pay

## 2019-01-30 DIAGNOSIS — Z3009 Encounter for other general counseling and advice on contraception: Secondary | ICD-10-CM | POA: Diagnosis not present

## 2019-01-30 DIAGNOSIS — F321 Major depressive disorder, single episode, moderate: Secondary | ICD-10-CM | POA: Diagnosis not present

## 2019-02-06 ENCOUNTER — Ambulatory Visit: Payer: Self-pay

## 2019-02-06 ENCOUNTER — Other Ambulatory Visit: Payer: Self-pay

## 2019-02-06 DIAGNOSIS — Z23 Encounter for immunization: Secondary | ICD-10-CM

## 2019-02-06 DIAGNOSIS — R300 Dysuria: Secondary | ICD-10-CM | POA: Diagnosis not present

## 2019-02-06 DIAGNOSIS — N3000 Acute cystitis without hematuria: Secondary | ICD-10-CM | POA: Diagnosis not present

## 2019-02-14 DIAGNOSIS — E669 Obesity, unspecified: Secondary | ICD-10-CM | POA: Diagnosis not present

## 2019-02-14 DIAGNOSIS — Z803 Family history of malignant neoplasm of breast: Secondary | ICD-10-CM | POA: Diagnosis not present

## 2019-02-14 DIAGNOSIS — Z309 Encounter for contraceptive management, unspecified: Secondary | ICD-10-CM | POA: Diagnosis not present

## 2019-02-19 DIAGNOSIS — F4322 Adjustment disorder with anxiety: Secondary | ICD-10-CM | POA: Diagnosis not present

## 2019-02-19 DIAGNOSIS — F321 Major depressive disorder, single episode, moderate: Secondary | ICD-10-CM | POA: Diagnosis not present

## 2019-02-27 DIAGNOSIS — Z30431 Encounter for routine checking of intrauterine contraceptive device: Secondary | ICD-10-CM | POA: Diagnosis not present

## 2019-02-27 DIAGNOSIS — F3342 Major depressive disorder, recurrent, in full remission: Secondary | ICD-10-CM | POA: Diagnosis not present

## 2019-04-08 DIAGNOSIS — Z20828 Contact with and (suspected) exposure to other viral communicable diseases: Secondary | ICD-10-CM | POA: Diagnosis not present

## 2019-04-09 ENCOUNTER — Encounter: Payer: Self-pay | Admitting: Family Medicine

## 2019-04-09 ENCOUNTER — Telehealth: Payer: Self-pay | Admitting: *Deleted

## 2019-04-09 NOTE — Telephone Encounter (Signed)
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 Patient stated that he and his family went yesterday and were tested for covid. Patient stated that they all feel fine, but has not gotten the test results back yet. 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

## 2019-04-09 NOTE — Telephone Encounter (Signed)
Pt notified of DR. Tower's comments he will update [place] when the test return he said he just will probably need a letter stating he had to quarantine until his results come back, as long as they are negative he will go back to work but he will call us once he gets results back to get a letter clearing him to go back to work if HR requires it

## 2019-04-09 NOTE — Telephone Encounter (Signed)
Let me know when test results return for him and his family  Advisement re: work will depend on the results (determines his level of exposure)  Of course let me know in the meantime if he develops any symptoms

## 2019-04-09 NOTE — Telephone Encounter (Signed)
See mychart pt hasn't been seen since 2014

## 2019-04-10 ENCOUNTER — Encounter: Payer: Self-pay | Admitting: Family Medicine

## 2019-04-18 ENCOUNTER — Telehealth: Payer: Self-pay

## 2019-04-18 NOTE — Telephone Encounter (Signed)
Patient called our office concerned about a covid exposure.  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  She remains asymptomatic and so does the patient.  Advised the patient to monitor his symptoms.  If he develops symptoms then he is to get tested immediately, if no symptoms then he should be tested in 6 days from exposure. From our standpoint, he may continue to work wearing a mask, washing hands and using 61f social distancing.  He is to follow up with HR to discuss returning to work.

## 2020-01-23 ENCOUNTER — Other Ambulatory Visit: Payer: Self-pay

## 2020-01-23 ENCOUNTER — Telehealth: Payer: Self-pay | Admitting: Nurse Practitioner

## 2020-01-23 ENCOUNTER — Ambulatory Visit: Payer: Self-pay

## 2020-01-23 DIAGNOSIS — Z20822 Contact with and (suspected) exposure to covid-19: Secondary | ICD-10-CM

## 2020-01-23 LAB — POC COVID19 BINAXNOW: SARS Coronavirus 2 Ag: NEGATIVE

## 2020-01-23 MED ORDER — AMOXICILLIN-POT CLAVULANATE 875-125 MG PO TABS: 1.0000 | ORAL_TABLET | Freq: Two times a day (BID) | ORAL | 0 refills | Status: AC

## 2020-01-23 NOTE — Progress Notes (Signed)
° °  Subjective:    Patient ID: Jeffrey Jenkins, male    DOB: 09-18-1985, 34 y.o.   MRN: [id number]  HPI   34 year old who has had 4 days of sick symptoms started with HA similar to allergies. Congestion. Missed work Monday, returned Tuesday and then started feeling worse Tuesday night with a fever of 99.5. Stayed home yesterday and started to feel better during the day and then sick again during the night with a fever that is relieved with fever.   Continues to have PND, using Mucinex for relief. Today he has had more of a productive cough.   He denies a history of asthma.   He does have a daughter that has had similar symptoms last week symptoms have resolved.   He has been vaccinated for COVID, denies any known exposure.   Today overall he is starting to feel better    Review of Systems  Constitutional: Positive for fever.  HENT: Positive for congestion, postnasal drip and rhinorrhea.   Respiratory: Positive for cough.   Cardiovascular: Negative.    Past Medical History:  Diagnosis Date   Environmental allergies    Ulcerative colitis       Objective:   Physical Exam  This was a telehealth visit after a nurse visit for Rapid COVID-19 testing.   Spoke on the phone patient in no acute distress.       Assessment & Plan:  Watch and wait Rx if symptoms worsen over the weekend. Discussed with patient that this is likely viral in nature at this point, advised to continue OTC medications for relief of symptoms. Mucinex, increase water intake, vitamins for immune support as discussed.   If symptoms worsen may start abx as discussed, or call and follow up at clinic for new symptoms. COVID risk low, vaccinated with two negative rapid tests and no exposure.   Recent Results (from the past 2160 hour(s))  POC COVID-19     Status: Normal   Collection Time: 01/23/20  3:17 PM  Result Value Ref Range   SARS Coronavirus 2 Ag Negative Negative    Comment: Vaccinated symptomatic patient  aware of negative POC results. Has telephone appointment with Jeffrey Ehlers FNP for determination of PCR and instructions.   Meds ordered this encounter  Medications   amoxicillin-clavulanate (AUGMENTIN) 875-125 MG tablet    Sig: Take 1 tablet by mouth 2 (two) times daily for 7 days.    Dispense:  14 tablet    Refill:  0

## 2020-02-13 DIAGNOSIS — Z20822 Contact with and (suspected) exposure to covid-19: Secondary | ICD-10-CM | POA: Diagnosis not present

## 2020-08-21 ENCOUNTER — Other Ambulatory Visit: Payer: Self-pay

## 2020-08-21 ENCOUNTER — Ambulatory Visit: Payer: Self-pay

## 2020-08-21 ENCOUNTER — Other Ambulatory Visit: Payer: Self-pay | Admitting: Family Medicine

## 2020-08-21 DIAGNOSIS — Z0000 Encounter for general adult medical examination without abnormal findings: Secondary | ICD-10-CM

## 2021-11-17 IMAGING — DX DG CHEST 1V
1 series · 1 of 1 positions shown · non-contrast
Comparison: None

CLINICAL DATA: Physical exam

EXAM:
CHEST  1 VIEW

[chest pa]
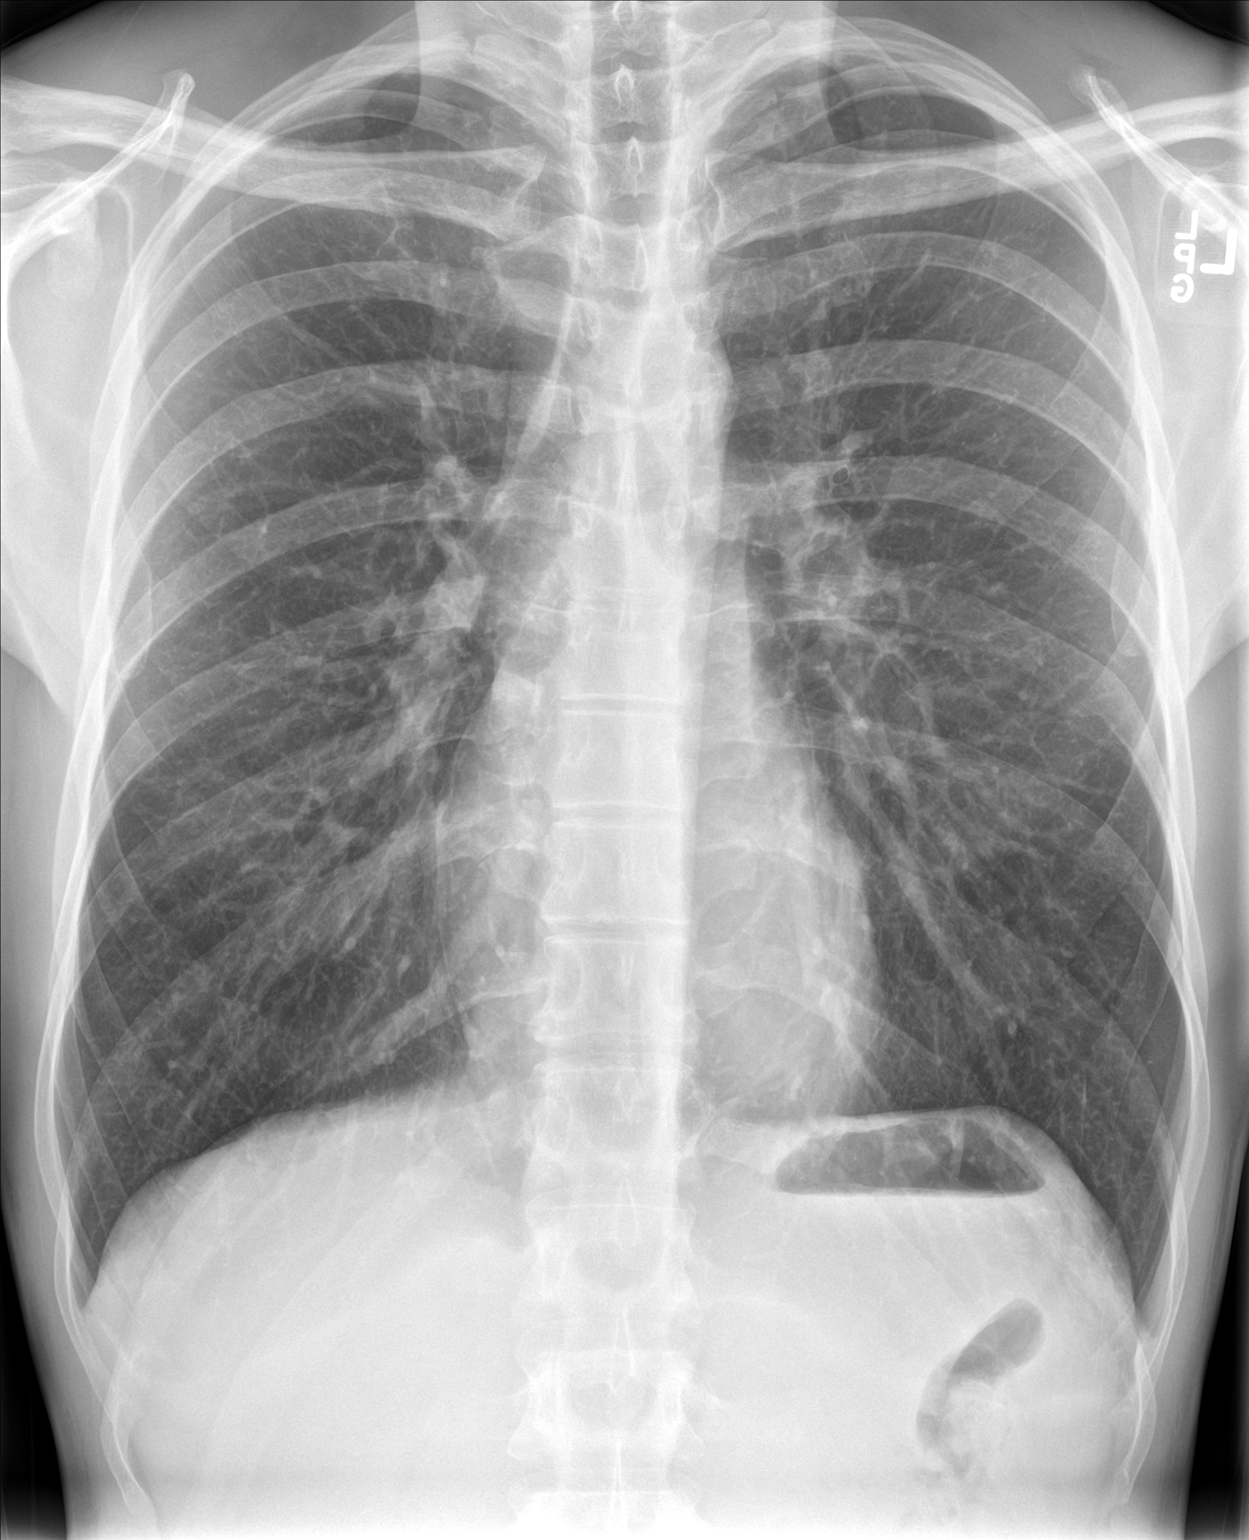

[1 of 1 positions shown; findings below may reference images not displayed]

FINDINGS: Normal heart size, mediastinal contours, and pulmonary vascularity.

Lungs mildly hyperinflated but clear.

No pulmonary infiltrate, pleural effusion, or pneumothorax.

Osseous structures unremarkable.
IMPRESSION: Mildly hyperinflated lungs without additional abnormalities.

## 2022-03-08 ENCOUNTER — Telehealth: Payer: BC Managed Care – PPO | Admitting: Nurse Practitioner

## 2022-03-08 DIAGNOSIS — U071 COVID-19: Secondary | ICD-10-CM

## 2022-03-08 MED ORDER — BENZONATATE 100 MG PO CAPS: 100.0000 mg | ORAL_CAPSULE | Freq: Three times a day (TID) | ORAL | 0 refills | Status: DC | PRN

## 2022-03-08 NOTE — Progress Notes (Signed)
Virtual Visit Consent   Jeffrey Jenkins, you are scheduled for a virtual visit with a [facility] provider today. Just as with appointments in the office, your consent must be obtained to participate. Your consent will be active for this visit and any virtual visit you may have with one of our providers in the next 365 days. If you have a MyChart account, a copy of this consent can be sent to you electronically.  As this is a virtual visit, video technology does not allow for your provider to perform a traditional examination. This may limit your provider's ability to fully assess your condition. If your provider identifies any concerns that need to be evaluated in person or the need to arrange testing (such as labs, EKG, etc.), we will make arrangements to do so. Although advances in technology are sophisticated, we cannot ensure that it will always work on either your end or our end. If the connection with a video visit is poor, the visit may have to be switched to a telephone visit. With either a video or telephone visit, we are not always able to ensure that we have a secure connection.  By engaging in this virtual visit, you consent to the provision of healthcare and authorize for your insurance to be billed (if applicable) for the services provided during this visit. Depending on your insurance coverage, you may receive a charge related to this service.  I need to obtain your verbal consent now. Are you willing to proceed with your visit today? Jeffrey Jenkins has provided verbal consent on 37/02/2022 for a virtual visit (video or telephone). Apolonio Schneiders, FNP  Date: 03/08/2022 1:50 PM  Virtual Visit via Video Note   I, Apolonio Schneiders, connected with  Jeffrey Jenkins  ([id number], 37-Apr-1987) on 03/08/22 at  2:00 PM EST by a video-enabled telemedicine application and verified that I am speaking with the correct person using two identifiers.  Location: Patient: Virtual Visit Location Patient:  Home Provider: Virtual Visit Location Provider: Home Office   I discussed the limitations of evaluation and management by telemedicine and the availability of in person appointments. The patient expressed understanding and agreed to proceed.    History of Present Illness: Jeffrey Jenkins is a 36 y.o. who identifies as a male who was assigned male at birth, and is being seen today for COVID instructions.  He tested positive yesterday at home yesterday  Symptom onset was 5 days ago.  Fever onset was 5 days ago, fever free for 24 hours now.   This is his first known COVID infection  He has been vaccinated for COVID, no booster in the past year   He is looking for instructions on going back to work   Symptoms today: fatigue, cough, mild headache.  He has been using motrin and tylenol over the counter  Cough is dry mostly   Denies a history of asthma   Problems:  Patient Active Problem List   Diagnosis Date Noted   Influenza with other respiratory manifestations 03/06/2013   Fever, unspecified 08/21/2012   NONSPECIFIC ABNORMAL RESULTS LIVR FUNCTION STUDY 12/15/2008   ULCERATIVE COLITIS-LEFT SIDE 10/11/2007   ALLERGIC RHINITIS 08/02/2006    Allergies:  Allergies  Allergen Reactions   Lialda [Mesalamine]     REACTION: sick-- pancreatitis   Medications:  Current Outpatient Medications:    cetirizine-pseudoephedrine (ZYRTEC-D) 5-120 MG per tablet, Take 1 tablet by mouth as needed.  , Disp: , Rfl:    fluticasone (FLONASE)  50 MCG/ACT nasal spray, Place 2 sprays into both nostrils daily., Disp: 16 g, Rfl: 6   Multiple Vitamin (MULTIVITAMIN) tablet, Take 1 tablet by mouth daily.  , Disp: , Rfl:    sodium chloride (OCEAN) 0.65 % SOLN nasal spray, Place 2 sprays into both nostrils every 2 (two) hours while awake for 30 days., Disp: , Rfl: 0  Observations/Objective: Patient is well-developed, well-nourished in no acute distress.  Resting comfortably  at home.  Head is normocephalic,  atraumatic.  No labored breathing.  Speech is clear and coherent with logical content.  Patient is alert and oriented at baseline.    Assessment and Plan: 1. COVID-19 Work  note provided - benzonatate (TESSALON) 100 MG capsule; Take 1 capsule (100 mg total) by mouth 3 (three) times daily as needed.  Dispense: 30 capsule; Refill: 0     Follow Up Instructions: I discussed the assessment and treatment plan with the patient. The patient was provided an opportunity to ask questions and all were answered. The patient agreed with the plan and demonstrated an understanding of the instructions.  A copy of instructions were sent to the patient via MyChart unless otherwise noted below.    The patient was advised to call back or seek an in-person evaluation if the symptoms worsen or if the condition fails to improve as anticipated.  Time:  I spent 10 minutes with the patient via telehealth technology discussing the above problems/concerns.    Apolonio Schneiders, FNP

## 2022-03-08 NOTE — Patient Instructions (Signed)
Push fluids  Rest  Over the counter Vitamin C and D3 for immune support  Assure adequate protein intake daily

## 2022-08-18 ENCOUNTER — Ambulatory Visit
Admission: RE | Admit: 2022-08-18 | Discharge: 2022-08-18 | Disposition: A | Discharge status: Home / Self Care | Payer: BC Managed Care – PPO | Source: Ambulatory Visit | Attending: Emergency Medicine | Admitting: Emergency Medicine

## 2022-08-18 VITALS — BP 103/62 | HR 86 | Temp 98.7°F | Resp 18

## 2022-08-18 DIAGNOSIS — K529 Noninfective gastroenteritis and colitis, unspecified: Secondary | ICD-10-CM

## 2022-08-18 MED ORDER — ONDANSETRON 4 MG PO TBDP: 4.0000 mg | ORAL_TABLET | Freq: Three times a day (TID) | ORAL | 0 refills | Status: DC | PRN

## 2022-08-18 NOTE — ED Triage Notes (Signed)
Patient presents to UC for HA, chills, upset stomach since last night. States went out for super and started feeling bad. Diarrhea and nausea since today. Treating symptoms tylenol, ibuprofen.

## 2022-08-18 NOTE — Discharge Instructions (Addendum)
Take the antinausea medication as directed.    Keep yourself hydrated with clear liquids, such as water and Gatorade.  Follow the diarrhea diet as tolerated.   Go to the emergency department if you have worsening symptoms.    Follow up with your primary care provider.      

## 2022-08-18 NOTE — ED Provider Notes (Signed)
Jeffrey Jenkins    CSN: [id number] Arrival date & time: 08/18/22  1710      History   Chief Complaint Chief Complaint  Patient presents with   Chills    Headache and chills last night and upset stomach. Bad headache today and chills. No appetite. - Entered by patient    HPI Jeffrey Jenkins is a 37 y.o. male.  Accompanied by his wife, patient presents with nausea and diarrhea since yesterday evening after eating shrimp tacos at a [employer].  He reports chills and headache today also.  He denies fever, abdominal pain, vomiting, blood in stool, dysuria, hematuria, or other symptoms.  He has history of ulcerative colitis but has not had symptoms in years.  No recent travel or antibiotic use.  No other family members with symptoms but they did not eat the shrimp.    The history is provided by the patient, the spouse and medical records.    Past Medical History:  Diagnosis Date   Environmental allergies    Ulcerative colitis     Patient Active Problem List   Diagnosis Date Noted   Influenza with other respiratory manifestations 03/06/2013   Fever, unspecified 08/21/2012   NONSPECIFIC ABNORMAL RESULTS LIVR FUNCTION STUDY 12/15/2008   ULCERATIVE COLITIS-LEFT SIDE 10/11/2007   ALLERGIC RHINITIS 08/02/2006    Past Surgical History:  Procedure Laterality Date   HERNIA REPAIR         Home Medications    Prior to Admission medications   Medication Sig Start Date End Date Taking? Authorizing Provider  ondansetron (ZOFRAN-ODT) 4 MG disintegrating tablet Take 1 tablet (4 mg total) by mouth every 8 (eight) hours as needed for nausea or vomiting. 08/18/22  Yes Mickie Bail, NP  benzonatate (TESSALON) 100 MG capsule Take 1 capsule (100 mg total) by mouth 3 (three) times daily as needed. 03/08/22   Viviano Simas, FNP  cetirizine-pseudoephedrine (ZYRTEC-D) 5-120 MG per tablet Take 1 tablet by mouth as needed.      [provider]  fluticasone (FLONASE) 50 MCG/ACT  nasal spray Place 2 sprays into both nostrils daily. 04/27/18   Betancourt, Jarold Song, NP  Multiple Vitamin (MULTIVITAMIN) tablet Take 1 tablet by mouth daily.      [provider]  sodium chloride (OCEAN) 0.65 % SOLN nasal spray Place 2 sprays into both nostrils every 2 (two) hours while awake for 30 days. 04/27/18 05/27/18  Betancourt, Jarold Song, NP    Family History Family History  Problem Relation Age of Onset   Heart attack Maternal Grandfather    Pancreatic cancer Paternal Grandmother    Alzheimer's disease Maternal Grandmother     Social History Social History   Tobacco Use   Smoking status: Never   Smokeless tobacco: Never  Vaping Use   Vaping Use: Never used  Substance Use Topics   Alcohol use: No   Drug use: No     Allergies   Lialda [mesalamine]   Review of Systems Review of Systems  Constitutional:  Negative for chills and fever.  Respiratory:  Negative for cough and shortness of breath.   Cardiovascular:  Negative for chest pain and palpitations.  Gastrointestinal:  Positive for diarrhea and nausea. Negative for abdominal pain, blood in stool, constipation and vomiting.  Genitourinary:  Negative for dysuria and hematuria.  Musculoskeletal:  Negative for arthralgias and back pain.  All other systems reviewed and are negative.    Physical Exam Triage Vital Signs ED Triage Vitals  Enc  Vitals Group     BP 08/18/22 1754 103/62     Pulse Rate 08/18/22 1743 86     Resp 08/18/22 1743 18     Temp 08/18/22 1743 98.7 F (37.1 C)     Temp src --      SpO2 08/18/22 1743 96 %     Weight --      Height --      Head Circumference --      Peak Flow --      Pain Score --      Pain Loc --      Pain Edu? --      Excl. in GC? --    No data found.  Updated Vital Signs BP 103/62 (BP Location: Right Arm)   Pulse 86   Temp 98.7 F (37.1 C)   Resp 18   SpO2 96%   Visual Acuity Right Eye Distance:   Left Eye Distance:   Bilateral Distance:    Right Eye  Near:   Left Eye Near:    Bilateral Near:     Physical Exam Vitals and nursing note reviewed.  Constitutional:      General: He is not in acute distress.    Appearance: Normal appearance. He is well-developed. He is not ill-appearing.  HENT:     Mouth/Throat:     Mouth: Mucous membranes are moist.  Cardiovascular:     Rate and Rhythm: Normal rate and regular rhythm.     Heart sounds: Normal heart sounds.  Pulmonary:     Effort: Pulmonary effort is normal. No respiratory distress.     Breath sounds: Normal breath sounds.  Abdominal:     General: Bowel sounds are increased.     Palpations: Abdomen is soft.     Tenderness: There is no abdominal tenderness. There is no right CVA tenderness, left CVA tenderness, guarding or rebound.  Musculoskeletal:     Cervical back: Neck supple.  Skin:    General: Skin is warm and dry.  Neurological:     Mental Status: He is alert.  Psychiatric:        Mood and Affect: Mood normal.        Behavior: Behavior normal.      UC Treatments / Results  Labs (all labs ordered are listed, but only abnormal results are displayed) Labs Reviewed - No data to display  EKG   Radiology No results found.  Procedures Procedures (including critical care time)  Medications Ordered in UC Medications - No data to display  Initial Impression / Assessment and Plan / UC Course  I have reviewed the triage vital signs and the nursing notes.  Pertinent labs & imaging results that were available during my care of the patient were reviewed by me and considered in my medical decision making (see chart for details).    Gastroenteritis.  Patient's symptoms started after eating shrimp at a restaurant last night.  Treating nausea with Zofran.  Discussed clear liquid diet.  Instructed patient to advance to diarrhea diet as tolerated.  Discussed maintaining oral hydration at home; ED precautions discussed.  Education provided on nausea and diarrhea.  Instructed  patient to follow-up with his PCP.  He agrees to plan of care.   Final Clinical Impressions(s) / UC Diagnoses   Final diagnoses:  Gastroenteritis     Discharge Instructions      Take the antinausea medication as directed.    Keep yourself hydrated with clear liquids, such as  water and Gatorade.  Follow the diarrhea diet as tolerated.   Go to the emergency department if you have worsening symptoms.    Follow up with your primary care provider.          ED Prescriptions     Medication Sig Dispense Auth. Provider   ondansetron (ZOFRAN-ODT) 4 MG disintegrating tablet Take 1 tablet (4 mg total) by mouth every 8 (eight) hours as needed for nausea or vomiting. 20 tablet Mickie Bail, NP      PDMP not reviewed this encounter.   Mickie Bail, NP 08/18/22 1816

## 2023-02-17 ENCOUNTER — Ambulatory Visit: Payer: BC Managed Care – PPO | Admitting: Family Medicine

## 2023-02-17 VITALS — BP 106/64 | HR 75 | Temp 98.1°F | Ht 72.5 in | Wt 139.4 lb

## 2023-02-17 DIAGNOSIS — Z23 Encounter for immunization: Secondary | ICD-10-CM | POA: Diagnosis not present

## 2023-02-17 DIAGNOSIS — R1031 Right lower quadrant pain: Secondary | ICD-10-CM | POA: Insufficient documentation

## 2023-02-17 DIAGNOSIS — K5150 Left sided colitis without complications: Secondary | ICD-10-CM | POA: Diagnosis not present

## 2023-02-17 NOTE — Patient Instructions (Addendum)
Unsure if your pain in the right inguinal area is from strain or possible hernia   I would like you to see Dr Patsy Lager (sport med) for further evaluation   Stop at check out to get an appointment (he is in this building)

## 2023-02-17 NOTE — Assessment & Plan Note (Signed)
Pt has right inguinal sensitivity and tenderness (with history of direct hernia repair on left years ago) Does lift /very active This is exacerbated by palpation /cough/sneeze  Reassuring exam/no palpable direct or indirect hernia and no testicular tenderness  Also normal rom of knee and hip  ? Groin muscle injury/pull vs hernia  Interested in sport med visit for their opinion Will watch for bulge /testicular change or increase in symptoms Call back and Er precautions noted in detail today   Will refer to sport med

## 2023-02-17 NOTE — Progress Notes (Unsigned)
Subjective:    Patient ID: Jeffrey Jenkins, male    DOB: September 24, 1985, 37 y.o.   MRN: [id number]  HPI  Wt Readings from Last 3 Encounters:  02/17/23 139 lb 6 oz (63.2 kg)  04/27/18 146 lb (66.2 kg)  03/06/13 141 lb 1.9 oz (64 kg)   18.64 kg/m  Vitals:   02/17/23 1125  BP: 106/64  Pulse: 75  Temp: 98.1 F (36.7 C)  SpO2: 98%    37 yo pt has been lost to follow up here for years  Pt presents for possible hernia in groin area    Over the summer started exercising more  Running this summer -- injured her thigh , wondered if stress fracture left side (pain outside -possibly hip bursitis)    Was running 10-12 mi per week  Did some yoga and stretching also / some floor exercises     Sept noted some pain to right of pelvis area  Ignored it for a while -though it was groin pull  Now if it gets bumped or if he lies on it he feels pain  No bulge that he has noticed No testicular changes   Had covid 3 wk ago and coughing made pain worse  Is sensitive  No pain with straining to lift or have bm (works at [employer]) - does fair amt of lifting - 50-70 lb   Does hurt to cough or sneeze    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 surgery  On left side      Patient Active Problem List   Diagnosis Date Noted   Right inguinal pain 02/17/2023   NONSPECIFIC ABNORMAL RESULTS LIVR FUNCTION STUDY 12/15/2008   ULCERATIVE COLITIS-LEFT SIDE 10/11/2007   Allergic rhinitis 08/02/2006   Past Medical History:  Diagnosis Date   Environmental allergies    Ulcerative colitis    Past Surgical History:  Procedure Laterality Date   HERNIA REPAIR     VASECTOMY     2021   Social History   Tobacco Use   Smoking status: Never   Smokeless tobacco: Never  Vaping Use   Vaping status: Never Used  Substance Use Topics   Alcohol use: No   Drug use: No   Family History  Problem Relation Age of Onset   Heart attack Maternal Grandfather    Pancreatic cancer  Paternal Grandmother    Alzheimer's disease Maternal Grandmother    Allergies  Allergen Reactions   Lialda [Mesalamine]     REACTION: sick-- pancreatitis   Current Outpatient Medications on File Prior to Visit  Medication Sig Dispense Refill   Multiple Vitamins-Minerals (MULTIVITAMIN WITH MINERALS) tablet Take 1 tablet by mouth daily.     No current facility-administered medications on file prior to visit.    Review of Systems  Constitutional:  Negative for activity change, appetite change, fatigue, fever and unexpected weight change.  HENT:  Negative for congestion, rhinorrhea, sore throat and trouble swallowing.   Eyes:  Negative for pain, redness, itching and visual disturbance.  Respiratory:  Negative for cough, chest tightness, shortness of breath and wheezing.   Cardiovascular:  Negative for chest pain and palpitations.  Gastrointestinal:  Negative for abdominal pain, blood in stool, constipation, diarrhea and nausea.  Endocrine: Negative for cold intolerance, heat intolerance, polydipsia and polyuria.  Genitourinary:  Negative for difficulty urinating, dysuria, frequency, penile discharge, penile pain, penile swelling, scrotal swelling, testicular pain and urgency.  Musculoskeletal:  Negative for arthralgias, joint  swelling and myalgias.  Skin:  Negative for pallor and rash.  Neurological:  Negative for dizziness, tremors, weakness, numbness and headaches.  Hematological:  Negative for adenopathy. Does not bruise/bleed easily.  Psychiatric/Behavioral:  Negative for decreased concentration and dysphoric mood. The patient is not nervous/anxious.        Objective:   Physical Exam Exam conducted with a chaperone present.  Constitutional:      General: He is not in acute distress.    Appearance: He is well-developed.  HENT:     Head: Normocephalic and atraumatic.  Eyes:     Conjunctiva/sclera: Conjunctivae normal.     Pupils: Pupils are equal, round, and reactive to light.   Neck:     Thyroid: No thyromegaly.     Vascular: No carotid bruit or JVD.  Cardiovascular:     Rate and Rhythm: Normal rate and regular rhythm.     Heart sounds: Normal heart sounds.     No gallop.  Pulmonary:     Effort: Pulmonary effort is normal. No respiratory distress.     Breath sounds: Normal breath sounds. No wheezing or rales.  Abdominal:     General: There is no distension or abdominal bruit.     Palpations: Abdomen is soft.     Hernia: There is no hernia in the left inguinal area or right inguinal area.  Genitourinary:    Penis: Normal.      Testes: Normal.     Epididymis:     Right: Normal.     Left: Normal.     Comments: Small area of tenderness in right inguinal area  No lump or bulge on palpation  No change in exam or increase in symptoms with valsalva   Musculoskeletal:     Cervical back: Normal range of motion and neck supple.     Right lower leg: No edema.     Left lower leg: No edema.     Comments: Normal rom of bilateral hips and LS   Lymphadenopathy:     Cervical: No cervical adenopathy.     Lower Body: No right inguinal adenopathy. No left inguinal adenopathy.  Skin:    General: Skin is warm and dry.     Coloration: Skin is not pale.     Findings: No rash.  Neurological:     Mental Status: He is alert.     Motor: No weakness.     Coordination: Coordination normal.     Gait: Gait normal.     Deep Tendon Reflexes: Reflexes are normal and symmetric. Reflexes normal.  Psychiatric:        Mood and Affect: Mood normal.           Assessment & Plan:   Problem List Items Addressed This Visit       Other   Right inguinal pain - Primary    Pt has right inguinal sensitivity and tenderness (with history of direct hernia repair on left years ago) Does lift /very active This is exacerbated by palpation /cough/sneeze  Reassuring exam/no palpable direct or indirect hernia and no testicular tenderness  Also normal rom of knee and hip  ? Groin  muscle injury/pull vs hernia  Interested in sport med visit for their opinion Will watch for bulge /testicular change or increase in symptoms Call back and Er precautions noted in detail today   Will refer to sport med

## 2023-02-19 NOTE — Assessment & Plan Note (Signed)
No longer on medication No symptoms at all Not interested in GI follow up at this time

## 2023-03-05 NOTE — Progress Notes (Unsigned)
    Emira Eubanks T. Brogen Duell, MD, CAQ Sports Medicine [facility] at [facility] [address] [place], [address]  Phone: [phone]  FAX: [phone]  RUSTON RAPOZO - 37 y.o. male  MRN [id number]  Date of Birth: 12/17/1985  Date: 03/06/2023  PCP: Judy Pimple, MD  Referral: Judy Pimple, MD  No chief complaint on file.  Subjective:   MCKALE WINBORN is a 37 y.o. very pleasant male patient with There is no height or weight on file to calculate BMI. who presents with the following:  Bryon is a very pleasant gentleman who is a new patient to me seen courtesy of referral from Dr. Milinda Antis.  She presents with some ongoing right sided groin and inguinal pain.  On exam, not felt to be hernia without significant bulging.    Review of Systems is noted in the HPI, as appropriate  Objective:   There were no vitals taken for this visit.  GEN: No acute distress; alert,appropriate. PULM: Breathing comfortably in no respiratory distress PSYCH: Normally interactive.   Laboratory and Imaging Data:  Assessment and Plan:   ***

## 2023-03-06 ENCOUNTER — Ambulatory Visit: Payer: BC Managed Care – PPO | Admitting: Family Medicine

## 2023-03-06 ENCOUNTER — Encounter: Payer: Self-pay | Admitting: Family Medicine

## 2023-03-06 VITALS — BP 120/70 | HR 94 | Temp 98.8°F | Ht 72.5 in | Wt 146.0 lb

## 2023-03-06 DIAGNOSIS — R1031 Right lower quadrant pain: Secondary | ICD-10-CM | POA: Diagnosis not present

## 2023-03-06 DIAGNOSIS — S3981XA Other specified injuries of abdomen, initial encounter: Secondary | ICD-10-CM

## 2023-03-06 DIAGNOSIS — M25552 Pain in left hip: Secondary | ICD-10-CM

## 2023-03-08 ENCOUNTER — Telehealth: Payer: Self-pay | Admitting: Family Medicine

## 2023-03-08 DIAGNOSIS — Z0000 Encounter for general adult medical examination without abnormal findings: Secondary | ICD-10-CM | POA: Insufficient documentation

## 2023-03-08 NOTE — Telephone Encounter (Signed)
-----   Message from Alvina Chou sent at 02/21/2023  3:51 PM EST ----- Regarding: Lab orders for Fri, 12.13.24 Patient is scheduled for CPX labs, please order future labs, Thanks , Camelia Eng

## 2023-03-10 ENCOUNTER — Other Ambulatory Visit (INDEPENDENT_AMBULATORY_CARE_PROVIDER_SITE_OTHER): Payer: BC Managed Care – PPO

## 2023-03-10 DIAGNOSIS — Z0000 Encounter for general adult medical examination without abnormal findings: Secondary | ICD-10-CM | POA: Diagnosis not present

## 2023-03-10 LAB — COMPREHENSIVE METABOLIC PANEL
ALT: 35 U/L (ref 0–53)
AST: 25 U/L (ref 0–37)
Albumin: 4.3 g/dL (ref 3.5–5.2)
Alkaline Phosphatase: 55 U/L (ref 39–117)
BUN: 19 mg/dL (ref 6–23)
CO2: 29 meq/L (ref 19–32)
Calcium: 9.2 mg/dL (ref 8.4–10.5)
Chloride: 104 meq/L (ref 96–112)
Creatinine, Ser: 0.93 mg/dL (ref 0.40–1.50)
GFR: 104.38 mL/min (ref >60.00)
Glucose, Bld: 102 mg/dL — ABNORMAL HIGH (ref 70–99)
Potassium: 4.5 meq/L (ref 3.5–5.1)
Sodium: 140 meq/L (ref 135–145)
Total Bilirubin: 1.8 mg/dL — ABNORMAL HIGH (ref 0.2–1.2)
Total Protein: 6.9 g/dL (ref 6.0–8.3)

## 2023-03-10 LAB — CBC WITH DIFFERENTIAL/PLATELET
Basophils Absolute: 0 10*3/uL (ref 0.0–0.1)
Basophils Relative: 0.5 % (ref 0.0–3.0)
Eosinophils Absolute: 0.4 10*3/uL (ref 0.0–0.7)
Eosinophils Relative: 4.7 % (ref 0.0–5.0)
HCT: 45.2 % (ref 39.0–52.0)
Hemoglobin: 15.7 g/dL (ref 13.0–17.0)
Lymphocytes Relative: 21.8 % (ref 12.0–46.0)
Lymphs Abs: 1.9 10*3/uL (ref 0.7–4.0)
MCHC: 34.8 g/dL (ref 30.0–36.0)
MCV: 88.6 fL (ref 78.0–100.0)
Monocytes Absolute: 0.5 10*3/uL (ref 0.1–1.0)
Monocytes Relative: 5.5 % (ref 3.0–12.0)
Neutro Abs: 5.7 10*3/uL (ref 1.4–7.7)
Neutrophils Relative %: 67.5 % (ref 43.0–77.0)
Platelets: 298 10*3/uL (ref 150.0–400.0)
RBC: 5.1 Mil/uL (ref 4.22–5.81)
RDW: 13.5 % (ref 11.5–15.5)
WBC: 8.5 10*3/uL (ref 4.0–10.5)

## 2023-03-10 LAB — LIPID PANEL
Cholesterol: 195 mg/dL (ref 0–200)
HDL: 70.5 mg/dL (ref >39.00)
LDL Cholesterol: 111 mg/dL — ABNORMAL HIGH (ref 0–99)
NonHDL: 124.39
Total CHOL/HDL Ratio: 3
Triglycerides: 66 mg/dL (ref 0.0–149.0)
VLDL: 13.2 mg/dL (ref 0.0–40.0)

## 2023-03-10 LAB — TSH: TSH: 1.27 u[IU]/mL (ref 0.35–5.50)

## 2023-03-17 ENCOUNTER — Ambulatory Visit (INDEPENDENT_AMBULATORY_CARE_PROVIDER_SITE_OTHER): Payer: BC Managed Care – PPO | Admitting: Family Medicine

## 2023-03-17 ENCOUNTER — Encounter: Payer: Self-pay | Admitting: Family Medicine

## 2023-03-17 VITALS — BP 112/68 | HR 76 | Temp 98.0°F | Ht 72.5 in | Wt 142.0 lb

## 2023-03-17 DIAGNOSIS — K5150 Left sided colitis without complications: Secondary | ICD-10-CM | POA: Diagnosis not present

## 2023-03-17 DIAGNOSIS — Z0000 Encounter for general adult medical examination without abnormal findings: Secondary | ICD-10-CM | POA: Diagnosis not present

## 2023-03-17 DIAGNOSIS — R1031 Right lower quadrant pain: Secondary | ICD-10-CM

## 2023-03-17 NOTE — Patient Instructions (Addendum)
If you want to get back to GI to discuss ulcerative colitis and guidelines for cancer screening- let us know   Keep eating a healthy diet  Stay active   Labs look good overall   Take care of yourself !

## 2023-03-17 NOTE — Assessment & Plan Note (Signed)
No symptoms  Last colonoscopy 09  Pt would rather not follow up with GI or get colonoscopy currently  He will call if he changes his mind

## 2023-03-17 NOTE — Assessment & Plan Note (Signed)
Did see Dr Patsy Lager Possible sports hernia  Is  improved

## 2023-03-17 NOTE — Assessment & Plan Note (Signed)
Reviewed health habits including diet and exercise and skin cancer prevention Reviewed appropriate screening tests for age  Also reviewed health mt list, fam hx and immunization status , as well as social and family history   See HPI Labs reviewed and ordered No concerns re: prostate  Will discuss colon cancer screening at 63 (declines follow up for UC) Discussed fall prevention, supplements and exercise for bone density  Encouraged to continue exercise  PHQ slightly elevated at 4 due to holiday stress/declines counseling or treatment  Health Maintenance  Topic Date Due   HIV Screening  Never done   COVID-19 Vaccine (1 - 2024-25 season) 02/16/2026*   DTaP/Tdap/Td vaccine (3 - Td or Tdap) 12/20/2027   Flu Shot  Completed   Hepatitis C Screening  Completed   HPV Vaccine  Aged Out  *Topic was postponed. The date shown is not the original due date.

## 2023-03-17 NOTE — Progress Notes (Signed)
Subjective:    Patient ID: Jeffrey Jenkins, male    DOB: [birth year], 37 y.o.   MRN: [id number]  HPI  Here for health maintenance exam and to review chronic medical problems   Wt Readings from Last 3 Encounters:  03/17/23 142 lb (64.4 kg)  03/06/23 146 lb (66.2 kg)  02/17/23 139 lb 6 oz (63.2 kg)   18.99 kg/m  Vitals:   03/17/23 0919  BP: 112/68  Pulse: 76  Temp: 98 F (36.7 C)  SpO2: 98%    Immunization History  Administered Date(s) Administered   Influenza Whole 12/27/2006, 01/25/2008, 12/30/2011   Influenza, Seasonal, Injecte, Preservative Fre 02/17/2023   Influenza,inj,Quad PF,6+ Mos 02/06/2019   Influenza-Unspecified 01/21/2013, 12/30/2016, 01/14/2020, 01/08/2022   Td 03/28/2006   Tdap 12/19/2017    There are no preventive care reminders to display for this patient.  Doing ok  Saw Dr Patsy Lager for groin injury  Doing some exercises   Went running Sunday and it went well and hip was ok also    HIV screen - declines / low risk  Monogamous    Prostate health No change in urination  No nocturia   None smoker  Does not drink alcohol    Colon cancer screening  History of left UC in past -colonoscopy 09  Last time was not interested in GI follow up  No flare in 15 years  Eats better  No loose stools     PGM had pancreatic cancer  No prostate cancer in family  No colon cancer in family     Bone health  Falls-none  Fractures-none  Supplements mvi daily  Drinks lots of milk with vit D    Exercise - regular including running    Mood    03/17/2023    9:18 AM 02/17/2023   11:28 AM  Depression screen PHQ 2/9  Decreased Interest 1 1  Down, Depressed, Hopeless 1 0  PHQ - 2 Score 2 1  Altered sleeping 1 0  Tired, decreased energy 0 1  Change in appetite 0 0  Feeling bad or failure about yourself  1 1  Trouble concentrating 0 0  Moving slowly or fidgety/restless 0 0  Suicidal thoughts 0 0  PHQ-9 Score 4 3  Difficult doing work/chores  Not difficult at all Not difficult at all   Doing ok mood wise  Holidays are stressful and new program at work     Cholesterol Lab Results  Component Value Date   CHOL 195 03/10/2023   CHOL 227 (HH) 11/29/2007   Lab Results  Component Value Date   HDL 70.50 03/10/2023   HDL 113.7 11/29/2007   Lab Results  Component Value Date   LDLCALC 111 (H) 03/10/2023   Lab Results  Component Value Date   TRIG 66.0 03/10/2023   TRIG 46 11/29/2007   Lab Results  Component Value Date   CHOLHDL 3 03/10/2023   CHOLHDL 2.0 CALC 11/29/2007   Lab Results  Component Value Date   LDLDIRECT 84.5 11/29/2007   Eats a lot of oatmeal and grains  Not a lot of red meat or fried foods   Other labs  Results for orders placed or performed in visit on 03/10/23  CBC with Differential/Platelet   Collection Time: 03/10/23  7:39 AM  Result Value Ref Range   WBC 8.5 4.0 - 10.5 K/uL   RBC 5.10 4.22 - 5.81 Mil/uL   Hemoglobin 15.7 13.0 - 17.0 g/dL   HCT 40.9 81.1 -  52.0 %   MCV 88.6 78.0 - 100.0 fl   MCHC 34.8 30.0 - 36.0 g/dL   RDW 70.6 23.7 - 62.8 %   Platelets 298.0 150.0 - 400.0 K/uL   Neutrophils Relative % 67.5 43.0 - 77.0 %   Lymphocytes Relative 21.8 12.0 - 46.0 %   Monocytes Relative 5.5 3.0 - 12.0 %   Eosinophils Relative 4.7 0.0 - 5.0 %   Basophils Relative 0.5 0.0 - 3.0 %   Neutro Abs 5.7 1.4 - 7.7 K/uL   Lymphs Abs 1.9 0.7 - 4.0 K/uL   Monocytes Absolute 0.5 0.1 - 1.0 K/uL   Eosinophils Absolute 0.4 0.0 - 0.7 K/uL   Basophils Absolute 0.0 0.0 - 0.1 K/uL  Comprehensive metabolic panel   Collection Time: 03/10/23  7:39 AM  Result Value Ref Range   Sodium 140 135 - 145 mEq/L   Potassium 4.5 3.5 - 5.1 mEq/L   Chloride 104 96 - 112 mEq/L   CO2 29 19 - 32 mEq/L   Glucose, Bld 102 (H) 70 - 99 mg/dL   BUN 19 6 - 23 mg/dL   Creatinine, Ser 3.15 0.40 - 1.50 mg/dL   Total Bilirubin 1.8 (H) 0.2 - 1.2 mg/dL   Alkaline Phosphatase 55 39 - 117 U/L   AST 25 0 - 37 U/L   ALT 35 0 - 53  U/L   Total Protein 6.9 6.0 - 8.3 g/dL   Albumin 4.3 3.5 - 5.2 g/dL   GFR 176.16 >07.37 mL/min   Calcium 9.2 8.4 - 10.5 mg/dL  Lipid panel   Collection Time: 03/10/23  7:39 AM  Result Value Ref Range   Cholesterol 195 0 - 200 mg/dL   Triglycerides 10.6 0.0 - 149.0 mg/dL   HDL 26.94 >85.46 mg/dL   VLDL 27.0 0.0 - 35.0 mg/dL   LDL Cholesterol 093 (H) 0 - 99 mg/dL   Total CHOL/HDL Ratio 3    NonHDL 124.39   TSH   Collection Time: 03/10/23  7:39 AM  Result Value Ref Range   TSH 1.27 0.35 - 5.50 uIU/mL        Patient Active Problem List   Diagnosis Date Noted   Routine general medical examination at a health care facility 03/08/2023   Right inguinal pain 02/17/2023   ULCERATIVE COLITIS-LEFT SIDE 10/11/2007   Past Medical History:  Diagnosis Date   Environmental allergies    Ulcerative colitis    Past Surgical History:  Procedure Laterality Date   HERNIA REPAIR     VASECTOMY     2021   Social History   Tobacco Use   Smoking status: Never   Smokeless tobacco: Never  Vaping Use   Vaping status: Never Used  Substance Use Topics   Alcohol use: No   Drug use: No   Family History  Problem Relation Age of Onset   Heart attack Maternal Grandfather    Pancreatic cancer Paternal Grandmother    Alzheimer's disease Maternal Grandmother    Allergies  Allergen Reactions   Lialda [Mesalamine]     REACTION: sick-- pancreatitis   Current Outpatient Medications on File Prior to Visit  Medication Sig Dispense Refill   Multiple Vitamins-Minerals (MULTIVITAMIN WITH MINERALS) tablet Take 1 tablet by mouth daily.     No current facility-administered medications on file prior to visit.    Review of Systems  Constitutional:  Negative for activity change, appetite change, fatigue, fever and unexpected weight change.  HENT:  Negative for congestion, rhinorrhea, sore  throat and trouble swallowing.   Eyes:  Negative for pain, redness, itching and visual disturbance.   Respiratory:  Negative for cough, chest tightness, shortness of breath and wheezing.   Cardiovascular:  Negative for chest pain and palpitations.  Gastrointestinal:  Negative for abdominal pain, blood in stool, constipation, diarrhea and nausea.  Endocrine: Negative for cold intolerance, heat intolerance, polydipsia and polyuria.  Genitourinary:  Negative for difficulty urinating, dysuria, frequency and urgency.       Groin/inguinal pain is better   Musculoskeletal:  Negative for arthralgias, joint swelling and myalgias.       Hip area pain is improved   Skin:  Negative for pallor and rash.  Neurological:  Negative for dizziness, tremors, weakness, numbness and headaches.  Hematological:  Negative for adenopathy. Does not bruise/bleed easily.  Psychiatric/Behavioral:  Negative for decreased concentration and dysphoric mood. The patient is not nervous/anxious.        Objective:   Physical Exam Constitutional:      General: He is not in acute distress.    Appearance: Normal appearance. He is well-developed and normal weight. He is not ill-appearing or diaphoretic.     Comments: Borderline underweight Physically fit  Not frail appearing   HENT:     Head: Normocephalic and atraumatic.     Right Ear: Tympanic membrane, ear canal and external ear normal.     Left Ear: Tympanic membrane, ear canal and external ear normal.     Nose: Nose normal. No congestion.     Mouth/Throat:     Mouth: Mucous membranes are moist.     Pharynx: Oropharynx is clear. No posterior oropharyngeal erythema.  Eyes:     General: No scleral icterus.       Right eye: No discharge.        Left eye: No discharge.     Conjunctiva/sclera: Conjunctivae normal.     Pupils: Pupils are equal, round, and reactive to light.  Neck:     Thyroid: No thyromegaly.     Vascular: No carotid bruit or JVD.  Cardiovascular:     Rate and Rhythm: Normal rate and regular rhythm.     Pulses: Normal pulses.     Heart sounds:  Normal heart sounds.     No gallop.  Pulmonary:     Effort: Pulmonary effort is normal. No respiratory distress.     Breath sounds: Normal breath sounds. No wheezing or rales.     Comments: Good air exch Chest:     Chest wall: No tenderness.  Abdominal:     General: Bowel sounds are normal. There is no distension or abdominal bruit.     Palpations: Abdomen is soft. There is no mass.     Tenderness: There is no abdominal tenderness.     Hernia: No hernia is present.  Musculoskeletal:        General: No tenderness.     Cervical back: Normal range of motion and neck supple. No rigidity. No muscular tenderness.     Right lower leg: No edema.     Left lower leg: No edema.  Lymphadenopathy:     Cervical: No cervical adenopathy.  Skin:    General: Skin is warm and dry.     Coloration: Skin is not pale.     Findings: No erythema or rash.     Comments: Solar lentigines diffusely   Neurological:     Mental Status: He is alert.     Cranial Nerves: No cranial nerve deficit.  Motor: No abnormal muscle tone.     Coordination: Coordination normal.     Gait: Gait normal.     Deep Tendon Reflexes: Reflexes are normal and symmetric. Reflexes normal.  Psychiatric:        Mood and Affect: Mood normal.        Cognition and Memory: Cognition and memory normal.           Assessment & Plan:   Problem List Items Addressed This Visit       Digestive   ULCERATIVE COLITIS-LEFT SIDE   No symptoms  Last colonoscopy 09  Pt would rather not follow up with GI or get colonoscopy currently  He will call if he changes his mind        Other   Routine general medical examination at a health care facility - Primary   Reviewed health habits including diet and exercise and skin cancer prevention Reviewed appropriate screening tests for age  Also reviewed health mt list, fam hx and immunization status , as well as social and family history   See HPI Labs reviewed and ordered No concerns re:  prostate  Will discuss colon cancer screening at 78 (declines follow up for UC) Discussed fall prevention, supplements and exercise for bone density  Encouraged to continue exercise  PHQ slightly elevated at 4 due to holiday stress/declines counseling or treatment  Health Maintenance  Topic Date Due   HIV Screening  Never done   COVID-19 Vaccine (1 - 2024-25 season) 02/16/2026*   DTaP/Tdap/Td vaccine (3 - Td or Tdap) 12/20/2027   Flu Shot  Completed   Hepatitis C Screening  Completed   HPV Vaccine  Aged Out  *Topic was postponed. The date shown is not the original due date.         Right inguinal pain   Did see Dr Patsy Lager Possible sports hernia  Is  improved

## 2023-05-25 ENCOUNTER — Other Ambulatory Visit: Payer: Self-pay | Admitting: Physician Assistant

## 2023-05-25 ENCOUNTER — Ambulatory Visit
Admission: RE | Admit: 2023-05-25 | Discharge: 2023-05-25 | Disposition: A | Discharge status: Home / Self Care | Payer: BC Managed Care – PPO | Source: Ambulatory Visit | Attending: Physician Assistant | Admitting: Physician Assistant

## 2023-05-25 DIAGNOSIS — Z0289 Encounter for other administrative examinations: Secondary | ICD-10-CM

## 2023-05-25 DIAGNOSIS — Z0000 Encounter for general adult medical examination without abnormal findings: Secondary | ICD-10-CM | POA: Diagnosis not present

## 2024-03-09 ENCOUNTER — Telehealth

## 2024-03-09 DIAGNOSIS — R6889 Other general symptoms and signs: Secondary | ICD-10-CM

## 2024-03-09 MED ORDER — GUAIFENESIN 200 MG PO TABS: 400.0000 mg | ORAL_TABLET | Freq: Four times a day (QID) | ORAL | 0 refills | Status: AC | PRN

## 2024-03-09 MED ORDER — OSELTAMIVIR PHOSPHATE 75 MG PO CAPS: 75.0000 mg | ORAL_CAPSULE | Freq: Two times a day (BID) | ORAL | 0 refills | Status: AC

## 2024-03-09 NOTE — Progress Notes (Signed)
 Virtual Visit Consent   Jeffrey Jenkins, you are scheduled for a virtual visit with a  provider today. Just as with appointments in the office, your consent must be obtained to participate. Your consent will be active for this visit and any virtual visit you may have with one of our providers in the next 365 days. If you have a MyChart account, a copy of this consent can be sent to you electronically.  As this is a virtual visit, video technology does not allow for your provider to perform a traditional examination. This may limit your provider's ability to fully assess your condition. If your provider identifies any concerns that need to be evaluated in person or the need to arrange testing (such as labs, EKG, etc.), we will make arrangements to do so. Although advances in technology are sophisticated, we cannot ensure that it will always work on either your end or our end. If the connection with a video visit is poor, the visit may have to be switched to a telephone visit. With either a video or telephone visit, we are not always able to ensure that we have a secure connection.  By engaging in this virtual visit, you consent to the provision of healthcare and authorize for your insurance to be billed (if applicable) for the services provided during this visit. Depending on your insurance coverage, you may receive a charge related to this service.  I need to obtain your verbal consent now. Are you willing to proceed with your visit today? Jeffrey Jenkins has provided verbal consent on 03/09/2024 for a virtual visit (video or telephone). Jeffrey Jenkins, [place]  Date: 03/09/2024 10:49 AM   Virtual Visit via Video Note   I, Jeffrey Jenkins, connected with  Jeffrey Jenkins  ([id number], 03-01-1986) on 03/09/2024 at 10:45 AM EST by a video-enabled telemedicine application and verified that I am speaking with the correct person using two identifiers.  Location: Patient: Virtual Visit Location Patient:  Home Provider: Virtual Visit Location Provider: Home Office   I discussed the limitations of evaluation and management by telemedicine and the availability of in person appointments. The patient expressed understanding and agreed to proceed.    History of Present Illness: Jeffrey Jenkins is a 38 y.o. who identifies as a male who was assigned male at birth, and is being seen today for c/o daughter tested positive for the Flu.  Wife started feeling bad Wednesday and she did a virtual visit and received Tamiflu  and he started feeling bad yesterday. Pt states he has a cough and feeling ill. Pt states he 100.9 fever this morning.   HPI: HPI  Problems:  Patient Active Problem List   Diagnosis Date Noted   Routine general medical examination at a health care facility 03/08/2023   Right inguinal pain 02/17/2023   ULCERATIVE COLITIS-LEFT SIDE 10/11/2007    Allergies: Allergies[1] Medications: Current Medications[2]  Observations/Objective: Patient is well-developed, well-nourished in no acute distress.  Resting comfortably at home.  Head is normocephalic, atraumatic.  No labored breathing.  Speech is clear and coherent with logical content.  Patient is alert and oriented at baseline.    Assessment and Plan: 1. Flu-like symptoms (Primary) - oseltamivir  (TAMIFLU ) 75 MG capsule; Take 1 capsule (75 mg total) by mouth 2 (two) times daily for 5 days.  Dispense: 10 capsule; Refill: 0 - guaiFENesin  200 MG tablet; Take 2 tablets (400 mg total) by mouth every 6 (six) hours as needed for up to 7 days  for cough or to loosen phlegm.  Dispense: 30 tablet; Refill: 0  -Start Tamiflu  -Pt advised to follow up in person with PCP or urgent care for worsening symptoms.   Follow Up Instructions: I discussed the assessment and treatment plan with the patient. The patient was provided an opportunity to ask questions and all were answered. The patient agreed with the plan and demonstrated an understanding of the  instructions.  A copy of instructions were sent to the patient via MyChart unless otherwise noted below.    The patient was advised to call back or seek an in-person evaluation if the symptoms worsen or if the condition fails to improve as anticipated.    [name], PA-C    [1]  Allergies Allergen Reactions   Lialda [Mesalamine]     REACTION: sick-- pancreatitis  [2]  Current Outpatient Medications:    guaiFENesin  200 MG tablet, Take 2 tablets (400 mg total) by mouth every 6 (six) hours as needed for up to 7 days for cough or to loosen phlegm., Disp: 30 tablet, Rfl: 0   oseltamivir  (TAMIFLU ) 75 MG capsule, Take 1 capsule (75 mg total) by mouth 2 (two) times daily for 5 days., Disp: 10 capsule, Rfl: 0   Multiple Vitamins-Minerals (MULTIVITAMIN WITH MINERALS) tablet, Take 1 tablet by mouth daily., Disp: , Rfl:

## 2024-03-09 NOTE — Patient Instructions (Signed)
 Jeffrey Jenkins, thank you for joining Roosvelt Mater, PA-C for today's virtual visit.  While this provider is not your primary care provider (PCP), if your PCP is located in our provider database this encounter information will be shared with them immediately following your visit.   A [facility] MyChart account gives you access to today's visit and all your visits, tests, and labs performed at [facility]  click here if you don't have a [facility] MyChart account or go to [email or web address]  Consent: (Patient) Jeffrey Jenkins provided verbal consent for this virtual visit at the beginning of the encounter.  Current Medications:  Current Outpatient Medications:    guaiFENesin  200 MG tablet, Take 2 tablets (400 mg total) by mouth every 6 (six) hours as needed for up to 7 days for cough or to loosen phlegm., Disp: 30 tablet, Rfl: 0   oseltamivir  (TAMIFLU ) 75 MG capsule, Take 1 capsule (75 mg total) by mouth 2 (two) times daily for 5 days., Disp: 10 capsule, Rfl: 0   Multiple Vitamins-Minerals (MULTIVITAMIN WITH MINERALS) tablet, Take 1 tablet by mouth daily., Disp: , Rfl:    Medications ordered in this encounter:  Meds ordered this encounter  Medications   oseltamivir  (TAMIFLU ) 75 MG capsule    Sig: Take 1 capsule (75 mg total) by mouth 2 (two) times daily for 5 days.    Dispense:  10 capsule    Refill:  0   guaiFENesin  200 MG tablet    Sig: Take 2 tablets (400 mg total) by mouth every 6 (six) hours as needed for up to 7 days for cough or to loosen phlegm.    Dispense:  30 tablet    Refill:  0     *If you need refills on other medications prior to your next appointment, please contact your pharmacy*  Follow-Up: Call back or seek an in-person evaluation if the symptoms worsen or if the condition fails to improve as anticipated.  [facility] [phone]  Other Instructions Influenza, Adult Influenza is also called the flu. It's an infection that  affects your respiratory tract. This includes your nose, throat, windpipe, and lungs. The flu is contagious. This means it spreads easily from person to person. It causes symptoms that are like a cold. It can also cause a high fever and body aches. What are the causes? The flu is caused by the influenza virus. You can get it by: Breathing in droplets that are in the air after an infected person coughs or sneezes. Touching something that has the virus on it and then touching your mouth, nose, or eyes. What increases the risk? You may be more likely to get the flu if: You don't wash your hands often. You're near a lot of people during cold and flu season. You touch your mouth, eyes, or nose without washing your hands first. You don't get a flu shot each year. You may also be more at risk for the flu and serious problems, such as a lung infection called pneumonia, if: You're older than 65. You're pregnant. Your immune system is weak. Your immune system is your body's defense system. You have a long-term, or chronic, condition, such as: Heart, kidney, or lung disease. Diabetes. A liver disorder. Asthma. You're very overweight. You have anemia. This is when you don't have enough red blood cells in your body. What are the signs or symptoms? Flu symptoms often start all of a sudden. They may last 4-14 days and  include: Fever and chills. Headaches, body aches, or muscle aches. Sore throat. Cough. Runny or stuffy nose. Discomfort in your chest. Not wanting to eat as much as normal. Feeling weak or tired. Feeling dizzy. Nausea or vomiting. How is this diagnosed? The flu may be diagnosed based on your symptoms and medical history. You may also have a physical exam. A swab may be taken from your nose or throat and tested for the virus. How is this treated? If the flu is found early, you can be treated with antiviral medicine. This may be given to you by mouth or through an IV. It can help  you feel less sick and get better faster. Taking care of yourself at home can also help your symptoms get better. Your health care provider may tell you to: Take over-the-counter medicines. Drink lots of fluids. The flu often goes away on its own. If you have very bad symptoms or problems caused by the flu, you may need to be treated in a hospital. Follow these instructions at home: Activity Rest as needed. Get lots of sleep. Stay home from work or school as told by your provider. Leave home only to go see your provider. Do not leave home for other reasons until you don't have a fever for 24 hours without taking medicine. Eating and drinking Take an oral rehydration solution (ORS). This is a drink that is sold at pharmacies and stores. Drink enough fluid to keep your pee pale yellow. Try to drink small amounts of clear fluids. These include water, ice chips, fruit juice mixed with water, and low-calorie sports drinks. Try to eat bland foods that are easy to digest. These include bananas, applesauce, rice, lean meats, toast, and crackers. Avoid drinks that have a lot of sugar or caffeine in them. These include energy drinks, regular sports drinks, and soda. Do not drink alcohol. Do not eat spicy or fatty foods. General instructions     Take your medicines only as told by your provider. Use a cool mist humidifier to add moisture to the air in your home. This can make it easier for you to breathe. You should also clean the humidifier every day. To do so: Empty the water. Pour clean water in. Cover your mouth and nose when you cough or sneeze. Wash your hands with soap and water often and for at least 20 seconds. It's extra important to do so after you cough or sneeze. If you can't use soap and water, use hand sanitizer. How is this prevented?  Get a flu shot every year. Ask your provider when you should get your flu shot. Stay away from people who are sick during fall and winter. Fall  and winter are cold and flu season. Contact a health care provider if: You get new symptoms. You have chest pain. You have watery poop, also called diarrhea. You have a fever. Your cough gets worse. You start to have more mucus. You feel like you may vomit, or you vomit. Get help right away if: You become short of breath or have trouble breathing. Your skin or nails turn blue. You have very bad pain or stiffness in your neck. You get a sudden headache or pain in your face or ear. You vomit each time you eat or drink. These symptoms may be an emergency. Call 911 right away. Do not wait to see if the symptoms will go away. Do not drive yourself to the hospital. This information is not intended to replace advice  given to you by your health care provider. Make sure you discuss any questions you have with your health care provider. Document Revised: 12/15/2022 Document Reviewed: 04/21/2022 Elsevier Patient Education  2024 [employer]vier [employer].   If you have been instructed to have an in-person evaluation today at a local Urgent Care facility, please use the link below. It will take you to a list of all of our available [facility], including address, phone number and hours of operation. Please do not delay care.  [facility]  If you or a family member do not have a primary care provider, use the link below to schedule a visit and establish care. When you choose a [facility] primary care physician or advanced practice provider, you gain a long-term partner in health. Find a Primary Care Provider  Learn more about [facility] in-office and virtual care options: [facility] - Get Care Now
